# Patient Record
Sex: Male | Born: 1960 | Race: White | Hispanic: No | Marital: Married | Smoking: Current every day smoker
Health system: Southern US, Community
[De-identification: ages and names within clinical notes are randomized; demographics above are authoritative.]

## PROBLEM LIST (undated history)

## (undated) DIAGNOSIS — F99 Mental disorder, not otherwise specified: Secondary | ICD-10-CM

## (undated) DIAGNOSIS — J189 Pneumonia, unspecified organism: Secondary | ICD-10-CM

## (undated) DIAGNOSIS — I10 Essential (primary) hypertension: Secondary | ICD-10-CM

## (undated) DIAGNOSIS — G709 Myoneural disorder, unspecified: Secondary | ICD-10-CM

## (undated) DIAGNOSIS — F329 Major depressive disorder, single episode, unspecified: Secondary | ICD-10-CM

## (undated) DIAGNOSIS — G4730 Sleep apnea, unspecified: Secondary | ICD-10-CM

## (undated) DIAGNOSIS — K219 Gastro-esophageal reflux disease without esophagitis: Secondary | ICD-10-CM

## (undated) DIAGNOSIS — I2510 Atherosclerotic heart disease of native coronary artery without angina pectoris: Secondary | ICD-10-CM

## (undated) DIAGNOSIS — F419 Anxiety disorder, unspecified: Secondary | ICD-10-CM

## (undated) DIAGNOSIS — F32A Depression, unspecified: Secondary | ICD-10-CM

## (undated) HISTORY — PX: ULNAR NERVE REPAIR: SHX2594

## (undated) HISTORY — PX: APPENDECTOMY: SHX54

## (undated) HISTORY — PX: TONSILLECTOMY: SUR1361

## (undated) HISTORY — PX: KNEE ARTHROSCOPY: SUR90

## (undated) HISTORY — PX: CORONARY ANGIOPLASTY: SHX604

---

## 2007-05-17 ENCOUNTER — Encounter: Admission: RE | Admit: 2007-05-17 | Discharge: 2007-05-17 | Payer: Self-pay | Admitting: Neurology

## 2007-07-26 ENCOUNTER — Ambulatory Visit: Payer: Self-pay | Admitting: Otolaryngology

## 2007-09-15 ENCOUNTER — Encounter: Admission: RE | Admit: 2007-09-15 | Discharge: 2007-09-15 | Payer: Self-pay | Admitting: Neurology

## 2008-01-18 ENCOUNTER — Encounter: Admission: RE | Admit: 2008-01-18 | Discharge: 2008-01-18 | Payer: Self-pay | Admitting: Neurology

## 2008-02-09 HISTORY — PX: BACK SURGERY: SHX140

## 2008-02-13 ENCOUNTER — Encounter: Admission: RE | Admit: 2008-02-13 | Discharge: 2008-02-13 | Payer: Self-pay | Admitting: Neurology

## 2008-08-22 ENCOUNTER — Encounter: Admission: RE | Admit: 2008-08-22 | Discharge: 2008-08-22 | Payer: Self-pay | Admitting: Orthopedic Surgery

## 2010-02-08 DIAGNOSIS — J189 Pneumonia, unspecified organism: Secondary | ICD-10-CM

## 2010-02-08 HISTORY — DX: Pneumonia, unspecified organism: J18.9

## 2010-02-26 ENCOUNTER — Encounter
Admission: RE | Admit: 2010-02-26 | Discharge: 2010-02-26 | Payer: Self-pay | Source: Home / Self Care | Attending: Neurology | Admitting: Neurology

## 2010-03-01 ENCOUNTER — Encounter: Payer: Self-pay | Admitting: Neurology

## 2010-06-11 ENCOUNTER — Ambulatory Visit: Payer: Self-pay | Admitting: Otolaryngology

## 2010-06-25 ENCOUNTER — Other Ambulatory Visit: Payer: Self-pay | Admitting: Neurology

## 2010-06-25 DIAGNOSIS — G544 Lumbosacral root disorders, not elsewhere classified: Secondary | ICD-10-CM

## 2010-06-25 DIAGNOSIS — G63 Polyneuropathy in diseases classified elsewhere: Secondary | ICD-10-CM

## 2010-06-25 DIAGNOSIS — G5600 Carpal tunnel syndrome, unspecified upper limb: Secondary | ICD-10-CM

## 2010-06-25 DIAGNOSIS — M48061 Spinal stenosis, lumbar region without neurogenic claudication: Secondary | ICD-10-CM

## 2010-06-25 DIAGNOSIS — G5620 Lesion of ulnar nerve, unspecified upper limb: Secondary | ICD-10-CM

## 2010-06-25 DIAGNOSIS — I679 Cerebrovascular disease, unspecified: Secondary | ICD-10-CM

## 2010-07-09 ENCOUNTER — Ambulatory Visit
Admission: RE | Admit: 2010-07-09 | Discharge: 2010-07-09 | Disposition: A | Discharge status: Home / Self Care | Payer: No Typology Code available for payment source | Source: Ambulatory Visit | Attending: Neurology | Admitting: Neurology

## 2010-07-09 DIAGNOSIS — G544 Lumbosacral root disorders, not elsewhere classified: Secondary | ICD-10-CM

## 2010-07-09 DIAGNOSIS — I679 Cerebrovascular disease, unspecified: Secondary | ICD-10-CM

## 2010-07-09 DIAGNOSIS — G5600 Carpal tunnel syndrome, unspecified upper limb: Secondary | ICD-10-CM

## 2010-07-09 DIAGNOSIS — G63 Polyneuropathy in diseases classified elsewhere: Secondary | ICD-10-CM

## 2010-07-09 DIAGNOSIS — G5620 Lesion of ulnar nerve, unspecified upper limb: Secondary | ICD-10-CM

## 2010-07-09 DIAGNOSIS — M48061 Spinal stenosis, lumbar region without neurogenic claudication: Secondary | ICD-10-CM

## 2010-07-09 MED ORDER — GADOBENATE DIMEGLUMINE 529 MG/ML IV SOLN
20.0000 mL | Freq: Once | INTRAVENOUS | Status: AC | PRN
  Administered 2010-07-09: 20 mL via INTRAVENOUS

## 2010-07-23 ENCOUNTER — Other Ambulatory Visit (HOSPITAL_COMMUNITY): Payer: Medicare Other | Admitting: Radiology

## 2010-07-29 ENCOUNTER — Ambulatory Visit (HOSPITAL_COMMUNITY): Payer: Medicare Other | Attending: Neurology | Admitting: Radiology

## 2010-07-29 ENCOUNTER — Other Ambulatory Visit (HOSPITAL_COMMUNITY): Payer: Self-pay | Admitting: Neurology

## 2010-07-29 DIAGNOSIS — F17200 Nicotine dependence, unspecified, uncomplicated: Secondary | ICD-10-CM | POA: Insufficient documentation

## 2010-07-29 DIAGNOSIS — I679 Cerebrovascular disease, unspecified: Secondary | ICD-10-CM | POA: Insufficient documentation

## 2010-07-29 DIAGNOSIS — G459 Transient cerebral ischemic attack, unspecified: Secondary | ICD-10-CM

## 2010-07-29 DIAGNOSIS — I319 Disease of pericardium, unspecified: Secondary | ICD-10-CM | POA: Insufficient documentation

## 2011-03-12 ENCOUNTER — Other Ambulatory Visit: Payer: Self-pay | Admitting: Orthopedic Surgery

## 2011-05-03 ENCOUNTER — Encounter (HOSPITAL_COMMUNITY): Payer: Self-pay | Admitting: Pharmacy Technician

## 2011-05-04 ENCOUNTER — Encounter (HOSPITAL_COMMUNITY)
Admission: RE | Admit: 2011-05-04 | Discharge: 2011-05-04 | Disposition: A | Discharge status: Home / Self Care | Payer: Medicare Other | Source: Ambulatory Visit | Attending: Orthopedic Surgery | Admitting: Orthopedic Surgery

## 2011-05-04 ENCOUNTER — Encounter (HOSPITAL_COMMUNITY): Payer: Self-pay

## 2011-05-04 HISTORY — DX: Sleep apnea, unspecified: G47.30

## 2011-05-04 HISTORY — DX: Pneumonia, unspecified organism: J18.9

## 2011-05-04 HISTORY — DX: Essential (primary) hypertension: I10

## 2011-05-04 HISTORY — DX: Atherosclerotic heart disease of native coronary artery without angina pectoris: I25.10

## 2011-05-04 HISTORY — DX: Mental disorder, not otherwise specified: F99

## 2011-05-04 HISTORY — DX: Major depressive disorder, single episode, unspecified: F32.9

## 2011-05-04 HISTORY — DX: Depression, unspecified: F32.A

## 2011-05-04 HISTORY — DX: Myoneural disorder, unspecified: G70.9

## 2011-05-04 LAB — DIFFERENTIAL
Basophils Absolute: 0 10*3/uL (ref 0.0–0.1)
Basophils Relative: 1 % (ref 0–1)
Eosinophils Relative: 1 % (ref 0–5)
Monocytes Absolute: 0.6 10*3/uL (ref 0.1–1.0)
Monocytes Relative: 7 % (ref 3–12)
Neutro Abs: 5.2 10*3/uL (ref 1.7–7.7)

## 2011-05-04 LAB — COMPREHENSIVE METABOLIC PANEL
ALT: 34 U/L (ref 0–53)
AST: 26 U/L (ref 0–37)
Albumin: 4.1 g/dL (ref 3.5–5.2)
Alkaline Phosphatase: 92 U/L (ref 39–117)
CO2: 26 mEq/L (ref 19–32)
Chloride: 103 mEq/L (ref 96–112)
GFR calc non Af Amer: >90 mL/min (ref >90)
Potassium: 4.3 mEq/L (ref 3.5–5.1)
Sodium: 138 mEq/L (ref 135–145)
Total Bilirubin: 0.3 mg/dL (ref 0.3–1.2)

## 2011-05-04 LAB — CBC
MCV: 85.5 fL (ref 78.0–100.0)
Platelets: 183 10*3/uL (ref 150–400)
RBC: 5.16 MIL/uL (ref 4.22–5.81)
RDW: 12.9 % (ref 11.5–15.5)
WBC: 8.9 10*3/uL (ref 4.0–10.5)

## 2011-05-04 LAB — APTT: aPTT: 36 seconds (ref 24–37)

## 2011-05-04 LAB — TYPE AND SCREEN: ABO/RH(D): O POS

## 2011-05-04 LAB — URINALYSIS, ROUTINE W REFLEX MICROSCOPIC
Glucose, UA: NEGATIVE mg/dL
Hgb urine dipstick: NEGATIVE
Leukocytes, UA: NEGATIVE
Protein, ur: NEGATIVE mg/dL
Specific Gravity, Urine: 1.011 (ref 1.005–1.030)
Urobilinogen, UA: 1 mg/dL (ref 0.0–1.0)

## 2011-05-04 LAB — SURGICAL PCR SCREEN
MRSA, PCR: POSITIVE — AB
Staphylococcus aureus: POSITIVE — AB

## 2011-05-04 NOTE — Progress Notes (Signed)
MESSAGE LEFT FOR  GWEN..RN AT [facility] CARDIOLOGY ... DR . Jason Fila.Marland KitchenMarland KitchenREQUESTED  LAST OV  EKG  AND STRESS TEST FOR THIS UPCOMING SURGERY 05/13/11...   [phone]

## 2011-05-04 NOTE — Pre-Procedure Instructions (Signed)
20 EZEKIAL Ayala  05/04/2011   Your procedure is scheduled on:  05/13/2011  Report to [facility] at 0730 AM.  Call this number if you have problems the morning of surgery: [phone]   Remember:   Do not eat food:After Midnight.  May have clear liquids: up to 4 Hours before arrival.  Clear liquids include soda, tea, black coffee, apple or grape juice, broth.  Take these medicines the morning of surgery with A SIP OF WATER: ATENOLOL  PROZAC   HYDROXYZINE  LAMICTAL  ZANTAC   Do not wear jewelry, make-up or nail polish.  Do not wear lotions, powders, or perfumes. You may wear deodorant.  Do not shave 48 hours prior to surgery.  Do not bring valuables to the hospital.  Contacts, dentures or bridgework may not be worn into surgery.  Leave suitcase in the car. After surgery it may be brought to your room.  For patients admitted to the hospital, checkout time is 11:00 AM the day of discharge.   Patients discharged the day of surgery will not be allowed to drive home.  Name and phone number of your driver: Johnathan Ayala-  [phone]  Special Instructions: CHG Shower Use Special Wash: 1/2 bottle night before surgery and 1/2 bottle morning of surgery.   Please read over the following fact sheets that you were given: Pain Booklet, Coughing and Deep Breathing, Blood Transfusion Information, MRSA Information and Surgical Site Infection Prevention

## 2011-05-06 NOTE — Progress Notes (Signed)
3/27/ spoke with Dedra Skeens at [facility] ... refaxed information /request  For latest ov  ekg  Tests   Etc  And confirmation of  Fax received.

## 2011-05-10 ENCOUNTER — Encounter (HOSPITAL_COMMUNITY): Payer: Self-pay | Admitting: Vascular Surgery

## 2011-05-10 NOTE — Consult Note (Signed)
Anesthesia:  Patient is a 51 year old male scheduled for XLIF L2-3, L3-4, L4-5, PLIF L 2-3 on 05/13/11.    History includes smoking, CAD s/p DES LAD & DIAG '03, PNA 2012, OSA, HTN, depression.  According to [facility] records he also has a history of anxiety, Bipolar disorder, borderline and narcissistic personality traits, and prior suicidal ideation.  His Cardiologist is Dr. Christella Noa at [facility].  PCP is Dr. Lindwood Qua.  He was seen at [facility] on 04/14/11 for routine and pre-operative evaluation.  EKG then showed NSR.  He had a stress test on 03/31/11 for chest pain.  It showed normal myocardial perfusion, Global systolic function was normal , EF 63%, RV chamber size was borderline dilated, coronary calcification involving the RCA, with a stent in the LAD territory were seen, a 8mm RUL pulmonary nodule without definitive uptake was seen abutting the mediastinum.  No further Cardiac testing was recommended pre-operatively.  According to notes, the finding of a pulmonary nodule on his stress test were discussed with him and smoking cessation and follow-up with his PCP were recommended.        According to Cardiology records, his last cath was in 2004 and showed insignificant atherosclerotic CAD with patent stents, preserved LVF.  Echo done at [facility] on 07/29/10 showed: - Left ventricle: The cavity size was normal. Wall thickness was increased in a pattern of mild LVH. The estimated ejection fraction was 60%. Wall motion was normal; there were no regional wall motion abnormalities. - Pericardium, extracardiac: There is an insignificant trivial pericardial effusion along the right atrial wall.  Patient had told his PAT nurse that he was unsure if he had had a recent CXR or CT scan.  I requested both of these from [facility] and Dr. Neita Garnet office as available.  If a CXR has not been done then he will need one on the day of surgery.  If a follow-up chest CT hasn't been done, then will defer timing to  that to has PCP. It should not interfere with proceeding to the OR if he is not having any acute cardiopulmonary symptoms.         Labs acceptable.

## 2011-05-12 MED ORDER — CEFAZOLIN SODIUM-DEXTROSE 2-3 GM-% IV SOLR
2.0000 g | INTRAVENOUS | Status: DC
  Filled 2011-05-12: qty 50

## 2011-05-12 MED ORDER — POVIDONE-IODINE 7.5 % EX SOLN
Freq: Once | CUTANEOUS | Status: DC
  Filled 2011-05-12: qty 118

## 2011-05-13 ENCOUNTER — Ambulatory Visit (HOSPITAL_COMMUNITY): Payer: Medicare Other | Admitting: Vascular Surgery

## 2011-05-13 ENCOUNTER — Ambulatory Visit (HOSPITAL_COMMUNITY): Payer: Medicare Other

## 2011-05-13 ENCOUNTER — Encounter (HOSPITAL_COMMUNITY)
Admission: RE | Disposition: A | Discharge status: Home Health | Payer: Self-pay | Source: Ambulatory Visit | Attending: Orthopedic Surgery

## 2011-05-13 ENCOUNTER — Encounter (HOSPITAL_COMMUNITY): Payer: Self-pay | Admitting: Vascular Surgery

## 2011-05-13 ENCOUNTER — Inpatient Hospital Stay (HOSPITAL_COMMUNITY)
Admission: RE | Admit: 2011-05-13 | Discharge: 2011-05-16 | DRG: 457 | Disposition: A | Discharge status: Home Health | Payer: Medicare Other | Source: Ambulatory Visit | Attending: Orthopedic Surgery | Admitting: Orthopedic Surgery

## 2011-05-13 ENCOUNTER — Encounter (HOSPITAL_COMMUNITY): Payer: Self-pay | Admitting: *Deleted

## 2011-05-13 DIAGNOSIS — F3289 Other specified depressive episodes: Secondary | ICD-10-CM | POA: Diagnosis present

## 2011-05-13 DIAGNOSIS — Z9861 Coronary angioplasty status: Secondary | ICD-10-CM

## 2011-05-13 DIAGNOSIS — M48061 Spinal stenosis, lumbar region without neurogenic claudication: Secondary | ICD-10-CM | POA: Diagnosis present

## 2011-05-13 DIAGNOSIS — F17200 Nicotine dependence, unspecified, uncomplicated: Secondary | ICD-10-CM | POA: Diagnosis present

## 2011-05-13 DIAGNOSIS — M5137 Other intervertebral disc degeneration, lumbosacral region: Secondary | ICD-10-CM | POA: Diagnosis present

## 2011-05-13 DIAGNOSIS — M4800 Spinal stenosis, site unspecified: Secondary | ICD-10-CM

## 2011-05-13 DIAGNOSIS — F329 Major depressive disorder, single episode, unspecified: Secondary | ICD-10-CM | POA: Diagnosis present

## 2011-05-13 DIAGNOSIS — M51379 Other intervertebral disc degeneration, lumbosacral region without mention of lumbar back pain or lower extremity pain: Secondary | ICD-10-CM | POA: Diagnosis present

## 2011-05-13 DIAGNOSIS — F05 Delirium due to known physiological condition: Secondary | ICD-10-CM | POA: Diagnosis not present

## 2011-05-13 DIAGNOSIS — I2510 Atherosclerotic heart disease of native coronary artery without angina pectoris: Secondary | ICD-10-CM | POA: Diagnosis present

## 2011-05-13 DIAGNOSIS — I10 Essential (primary) hypertension: Secondary | ICD-10-CM | POA: Diagnosis present

## 2011-05-13 DIAGNOSIS — M4120 Other idiopathic scoliosis, site unspecified: Principal | ICD-10-CM | POA: Diagnosis present

## 2011-05-13 HISTORY — PX: ANTERIOR LAT LUMBAR FUSION: SHX1168

## 2011-05-13 SURGERY — ANTERIOR LATERAL LUMBAR FUSION 3 LEVELS
Anesthesia: General | Site: Flank | Laterality: Right

## 2011-05-13 MED ORDER — MORPHINE SULFATE (PF) 1 MG/ML IV SOLN
INTRAVENOUS | Status: DC
  Administered 2011-05-13: 18:00:00 via INTRAVENOUS
  Administered 2011-05-14: 10 mg via INTRAVENOUS
  Administered 2011-05-14: 9 mg via INTRAVENOUS

## 2011-05-13 MED ORDER — ONDANSETRON HCL 4 MG/2ML IJ SOLN: 4.0000 mg | INTRAMUSCULAR | Status: DC | PRN

## 2011-05-13 MED ORDER — SODIUM CHLORIDE 0.9 % IJ SOLN: 3.0000 mL | INTRAMUSCULAR | Status: DC | PRN

## 2011-05-13 MED ORDER — NIACIN 500 MG PO TABS
500.0000 mg | ORAL_TABLET | Freq: Every day | ORAL | Status: DC
  Administered 2011-05-14 – 2011-05-15 (×2): 500 mg via ORAL
  Filled 2011-05-13 (×4): qty 1

## 2011-05-13 MED ORDER — FENTANYL CITRATE 0.05 MG/ML IJ SOLN
INTRAMUSCULAR | Status: DC | PRN
  Administered 2011-05-13: 100 ug via INTRAVENOUS
  Administered 2011-05-13 (×3): 50 ug via INTRAVENOUS
  Administered 2011-05-13: 100 ug via INTRAVENOUS
  Administered 2011-05-13 (×3): 50 ug via INTRAVENOUS
  Administered 2011-05-13: 150 ug via INTRAVENOUS
  Administered 2011-05-13: 50 ug via INTRAVENOUS
  Administered 2011-05-13: 200 ug via INTRAVENOUS
  Administered 2011-05-13: 100 ug via INTRAVENOUS

## 2011-05-13 MED ORDER — SUCCINYLCHOLINE CHLORIDE 20 MG/ML IJ SOLN
INTRAMUSCULAR | Status: DC | PRN
  Administered 2011-05-13: 100 mg via INTRAVENOUS

## 2011-05-13 MED ORDER — ACETAMINOPHEN 650 MG RE SUPP: 650.0000 mg | RECTAL | Status: DC | PRN

## 2011-05-13 MED ORDER — MENTHOL 3 MG MT LOZG
1.0000 | LOZENGE | OROMUCOSAL | Status: DC | PRN
  Filled 2011-05-13: qty 9

## 2011-05-13 MED ORDER — ONDANSETRON HCL 4 MG/2ML IJ SOLN: 4.0000 mg | Freq: Four times a day (QID) | INTRAMUSCULAR | Status: DC | PRN

## 2011-05-13 MED ORDER — NALOXONE HCL 0.4 MG/ML IJ SOLN: 0.4000 mg | INTRAMUSCULAR | Status: DC | PRN

## 2011-05-13 MED ORDER — MEPERIDINE HCL 25 MG/ML IJ SOLN
25.0000 mg | Freq: Once | INTRAMUSCULAR | Status: AC
  Administered 2011-05-13 (×2): 12.5 mg via INTRAVENOUS

## 2011-05-13 MED ORDER — PHENOL 1.4 % MT LIQD: 1.0000 | OROMUCOSAL | Status: DC | PRN

## 2011-05-13 MED ORDER — VANCOMYCIN HCL 1000 MG IV SOLR
1500.0000 mg | Freq: Once | INTRAVENOUS | Status: AC
  Administered 2011-05-14: 1500 mg via INTRAVENOUS
  Filled 2011-05-13: qty 1500

## 2011-05-13 MED ORDER — THROMBIN 20000 UNITS EX KIT
PACK | CUTANEOUS | Status: DC | PRN
  Administered 2011-05-13: 09:00:00 via TOPICAL

## 2011-05-13 MED ORDER — VANCOMYCIN HCL IN DEXTROSE 1-5 GM/200ML-% IV SOLN
INTRAVENOUS | Status: AC
  Filled 2011-05-13: qty 200

## 2011-05-13 MED ORDER — POTASSIUM CHLORIDE IN NACL 20-0.9 MEQ/L-% IV SOLN
INTRAVENOUS | Status: DC
  Administered 2011-05-13: 80 mL/h via INTRAVENOUS
  Administered 2011-05-14: 14:00:00 via INTRAVENOUS
  Filled 2011-05-13 (×7): qty 1000

## 2011-05-13 MED ORDER — DEXAMETHASONE SODIUM PHOSPHATE 4 MG/ML IJ SOLN
INTRAMUSCULAR | Status: DC | PRN
  Administered 2011-05-13: 12 mg via INTRAVENOUS

## 2011-05-13 MED ORDER — ATENOLOL 50 MG PO TABS
50.0000 mg | ORAL_TABLET | Freq: Every day | ORAL | Status: DC
  Administered 2011-05-14 – 2011-05-16 (×3): 50 mg via ORAL
  Filled 2011-05-13 (×3): qty 1

## 2011-05-13 MED ORDER — OXYCODONE-ACETAMINOPHEN 5-325 MG PO TABS: 1.0000 | ORAL_TABLET | ORAL | Status: DC | PRN

## 2011-05-13 MED ORDER — CLOZAPINE 100 MG PO TABS
400.0000 mg | ORAL_TABLET | Freq: Every day | ORAL | Status: DC
  Administered 2011-05-13: 200 mg via ORAL
  Administered 2011-05-14 – 2011-05-15 (×2): 400 mg via ORAL
  Filled 2011-05-13 (×4): qty 4

## 2011-05-13 MED ORDER — FAMOTIDINE 20 MG PO TABS
20.0000 mg | ORAL_TABLET | Freq: Every day | ORAL | Status: DC
  Administered 2011-05-14 – 2011-05-16 (×3): 20 mg via ORAL
  Filled 2011-05-13 (×3): qty 1

## 2011-05-13 MED ORDER — DIPHENHYDRAMINE HCL 50 MG/ML IJ SOLN: 12.5000 mg | Freq: Four times a day (QID) | INTRAMUSCULAR | Status: DC | PRN

## 2011-05-13 MED ORDER — LAMOTRIGINE 200 MG PO TABS
200.0000 mg | ORAL_TABLET | Freq: Two times a day (BID) | ORAL | Status: DC
  Administered 2011-05-14 – 2011-05-16 (×5): 200 mg via ORAL
  Filled 2011-05-13 (×7): qty 1

## 2011-05-13 MED ORDER — DIPHENHYDRAMINE HCL 12.5 MG/5ML PO ELIX
12.5000 mg | ORAL_SOLUTION | Freq: Four times a day (QID) | ORAL | Status: DC | PRN
  Filled 2011-05-13: qty 5

## 2011-05-13 MED ORDER — ONDANSETRON HCL 4 MG/2ML IJ SOLN
INTRAMUSCULAR | Status: DC | PRN
  Administered 2011-05-13: 4 mg via INTRAVENOUS

## 2011-05-13 MED ORDER — CALCIUM CHLORIDE 10 % IV SOLN
INTRAVENOUS | Status: DC | PRN
  Administered 2011-05-13: .1 g via INTRAVENOUS

## 2011-05-13 MED ORDER — HYDROMORPHONE HCL PF 1 MG/ML IJ SOLN: 0.5000 mg | INTRAMUSCULAR | Status: DC | PRN

## 2011-05-13 MED ORDER — 0.9 % SODIUM CHLORIDE (POUR BTL) OPTIME
TOPICAL | Status: DC | PRN
  Administered 2011-05-13 (×2): 1000 mL

## 2011-05-13 MED ORDER — ZOLPIDEM TARTRATE 5 MG PO TABS: 5.0000 mg | ORAL_TABLET | Freq: Every evening | ORAL | Status: DC | PRN

## 2011-05-13 MED ORDER — VANCOMYCIN HCL 1000 MG IV SOLR
1000.0000 mg | INTRAVENOUS | Status: DC | PRN
  Administered 2011-05-13: 1000 mg via INTRAVENOUS

## 2011-05-13 MED ORDER — LACTATED RINGERS IV SOLN
INTRAVENOUS | Status: DC | PRN
  Administered 2011-05-13 (×5): via INTRAVENOUS

## 2011-05-13 MED ORDER — LIDOCAINE HCL (CARDIAC) 20 MG/ML IV SOLN
INTRAVENOUS | Status: DC | PRN
  Administered 2011-05-13: 80 mg via INTRAVENOUS

## 2011-05-13 MED ORDER — MORPHINE SULFATE 4 MG/ML IJ SOLN
2.0000 mg | INTRAMUSCULAR | Status: DC | PRN
  Administered 2011-05-14 (×2): 2 mg via INTRAVENOUS
  Filled 2011-05-13 (×2): qty 1

## 2011-05-13 MED ORDER — BUPIVACAINE-EPINEPHRINE 0.25% -1:200000 IJ SOLN
INTRAMUSCULAR | Status: DC | PRN
  Administered 2011-05-13: 5.5 mL
  Administered 2011-05-13: 9 mL

## 2011-05-13 MED ORDER — PROPOFOL 10 MG/ML IV EMUL
INTRAVENOUS | Status: DC | PRN
  Administered 2011-05-13: 50 mg via INTRAVENOUS
  Administered 2011-05-13: 300 mg via INTRAVENOUS

## 2011-05-13 MED ORDER — PROPOFOL 10 MG/ML IV EMUL
INTRAVENOUS | Status: DC | PRN
  Administered 2011-05-13: 100 ug/kg/min via INTRAVENOUS

## 2011-05-13 MED ORDER — ONDANSETRON HCL 4 MG/2ML IJ SOLN: 4.0000 mg | Freq: Once | INTRAMUSCULAR | Status: DC | PRN

## 2011-05-13 MED ORDER — DOCUSATE SODIUM 100 MG PO CAPS
100.0000 mg | ORAL_CAPSULE | Freq: Two times a day (BID) | ORAL | Status: DC
  Administered 2011-05-14 – 2011-05-16 (×5): 100 mg via ORAL
  Filled 2011-05-13 (×6): qty 1

## 2011-05-13 MED ORDER — SODIUM CHLORIDE 0.9 % IV SOLN: 250.0000 mL | INTRAVENOUS | Status: DC

## 2011-05-13 MED ORDER — PHENYLEPHRINE HCL 10 MG/ML IJ SOLN
INTRAMUSCULAR | Status: DC | PRN
  Administered 2011-05-13 (×6): 80 ug via INTRAVENOUS

## 2011-05-13 MED ORDER — HYDROMORPHONE HCL PF 1 MG/ML IJ SOLN
0.2500 mg | INTRAMUSCULAR | Status: DC | PRN
  Administered 2011-05-13 (×2): 0.5 mg via INTRAVENOUS
  Administered 2011-05-13: 19:00:00 via INTRAVENOUS
  Administered 2011-05-13 (×3): 0.5 mg via INTRAVENOUS

## 2011-05-13 MED ORDER — SODIUM CHLORIDE 0.9 % IJ SOLN: 9.0000 mL | INTRAMUSCULAR | Status: DC | PRN

## 2011-05-13 MED ORDER — ACETAMINOPHEN 325 MG PO TABS: 650.0000 mg | ORAL_TABLET | ORAL | Status: DC | PRN

## 2011-05-13 MED ORDER — MIDAZOLAM HCL 5 MG/5ML IJ SOLN
INTRAMUSCULAR | Status: DC | PRN
  Administered 2011-05-13: 2 mg via INTRAVENOUS

## 2011-05-13 MED ORDER — EPHEDRINE SULFATE 50 MG/ML IJ SOLN
INTRAMUSCULAR | Status: DC | PRN
  Administered 2011-05-13 (×2): 15 mg via INTRAVENOUS
  Administered 2011-05-13: 20 mg via INTRAVENOUS

## 2011-05-13 MED ORDER — HETASTARCH-ELECTROLYTES 6 % IV SOLN
INTRAVENOUS | Status: DC | PRN
  Administered 2011-05-13 (×2): via INTRAVENOUS

## 2011-05-13 MED ORDER — SENNA 8.6 MG PO TABS
1.0000 | ORAL_TABLET | Freq: Two times a day (BID) | ORAL | Status: DC
  Administered 2011-05-14 – 2011-05-16 (×5): 8.6 mg via ORAL
  Filled 2011-05-13 (×7): qty 1

## 2011-05-13 MED ORDER — SODIUM CHLORIDE 0.9 % IJ SOLN
3.0000 mL | Freq: Two times a day (BID) | INTRAMUSCULAR | Status: DC
  Administered 2011-05-14 – 2011-05-16 (×4): 3 mL via INTRAVENOUS

## 2011-05-13 MED ORDER — HYDROXYZINE HCL 50 MG PO TABS
50.0000 mg | ORAL_TABLET | Freq: Two times a day (BID) | ORAL | Status: DC
  Administered 2011-05-14 – 2011-05-16 (×5): 50 mg via ORAL
  Filled 2011-05-13 (×7): qty 1

## 2011-05-13 MED ORDER — FLUOXETINE HCL 20 MG PO CAPS
60.0000 mg | ORAL_CAPSULE | Freq: Every day | ORAL | Status: DC
  Administered 2011-05-14 – 2011-05-16 (×3): 60 mg via ORAL
  Filled 2011-05-13 (×3): qty 3

## 2011-05-13 MED ORDER — DIAZEPAM 5 MG PO TABS: 5.0000 mg | ORAL_TABLET | Freq: Four times a day (QID) | ORAL | Status: DC | PRN

## 2011-05-13 MED ORDER — PHENYLEPHRINE HCL 10 MG/ML IJ SOLN
10.0000 mg | INTRAVENOUS | Status: DC | PRN
  Administered 2011-05-13: 10 ug/min via INTRAVENOUS
  Administered 2011-05-13: 09:00:00 via INTRAVENOUS

## 2011-05-13 MED ORDER — ADULT MULTIVITAMIN W/MINERALS CH
1.0000 | ORAL_TABLET | Freq: Every day | ORAL | Status: DC
  Administered 2011-05-14 – 2011-05-16 (×3): 1 via ORAL
  Filled 2011-05-13 (×3): qty 1

## 2011-05-13 MED ORDER — ALUM & MAG HYDROXIDE-SIMETH 200-200-20 MG/5ML PO SUSP: 30.0000 mL | Freq: Four times a day (QID) | ORAL | Status: DC | PRN

## 2011-05-13 MED ORDER — OXYCODONE HCL 20 MG PO TB12
20.0000 mg | ORAL_TABLET | Freq: Two times a day (BID) | ORAL | Status: DC
  Administered 2011-05-14 – 2011-05-15 (×4): 20 mg via ORAL
  Filled 2011-05-13 (×4): qty 1

## 2011-05-13 SURGICAL SUPPLY — 114 items
APPLIER CLIP 11 MED OPEN (CLIP) ×3
ATTRACTOMAT 16X20 MAGNETIC DRP (DRAPES) ×3 IMPLANT
BENZOIN TINCTURE PRP APPL 2/3 (GAUZE/BANDAGES/DRESSINGS) ×12 IMPLANT
BLADE SURG 10 STRL SS (BLADE) ×3 IMPLANT
BLADE SURG ROTATE 9660 (MISCELLANEOUS) ×3 IMPLANT
BUR ROUND PRECISION 4.0 (BURR) ×3 IMPLANT
CAGE COUGAR LATERAL 18X10X55 (Cage) ×3 IMPLANT
CARTRIDGE OIL MAESTRO DRILL (MISCELLANEOUS) ×2 IMPLANT
CLIP APPLIE 11 MED OPEN (CLIP) ×2 IMPLANT
CLOTH BEACON ORANGE TIMEOUT ST (SAFETY) ×3 IMPLANT
CONT SPEC STER OR (MISCELLANEOUS) IMPLANT
CORDS BIPOLAR (ELECTRODE) ×6 IMPLANT
COVER MAYO STAND STRL (DRAPES) ×27 IMPLANT
COVER SURGICAL LIGHT HANDLE (MISCELLANEOUS) ×6 IMPLANT
Cougar LS Cage 18x12x55 Parallel ×6 IMPLANT
DIFFUSER DRILL AIR PNEUMATIC (MISCELLANEOUS) IMPLANT
DISSECTOR BLUNT TIP ENDO 5MM (MISCELLANEOUS) ×3 IMPLANT
DRAIN CHANNEL 15F RND FF W/TCR (WOUND CARE) IMPLANT
DRAPE C-ARM 42X72 X-RAY (DRAPES) ×9 IMPLANT
DRAPE INCISE IOBAN 66X45 STRL (DRAPES) ×9 IMPLANT
DRAPE ORTHO SPLIT 77X108 STRL (DRAPES) ×1
DRAPE POUCH INSTRU U-SHP 10X18 (DRAPES) ×6 IMPLANT
DRAPE SURG 17X23 STRL (DRAPES) ×24 IMPLANT
DRAPE SURG ORHT 6 SPLT 77X108 (DRAPES) ×2 IMPLANT
DRAPE U-SHAPE 47X51 STRL (DRAPES) IMPLANT
DRSG MEPILEX BORDER 4X12 (GAUZE/BANDAGES/DRESSINGS) IMPLANT
DRSG MEPILEX BORDER 4X8 (GAUZE/BANDAGES/DRESSINGS) IMPLANT
DURAPREP 26ML APPLICATOR (WOUND CARE) IMPLANT
ELECT BLADE 4.0 EZ CLEAN MEGAD (MISCELLANEOUS) ×3
ELECT BLADE 6.5 EXT (BLADE) ×3 IMPLANT
ELECT CAUTERY BLADE 6.4 (BLADE) ×3 IMPLANT
ELECT REM PT RETURN 9FT ADLT (ELECTROSURGICAL) ×6
ELECTRODE BLDE 4.0 EZ CLN MEGD (MISCELLANEOUS) ×2 IMPLANT
ELECTRODE REM PT RTRN 9FT ADLT (ELECTROSURGICAL) ×4 IMPLANT
EVACUATOR SILICONE 100CC (DRAIN) IMPLANT
GAUZE SPONGE 4X4 16PLY XRAY LF (GAUZE/BANDAGES/DRESSINGS) ×12 IMPLANT
GLOVE BIO SURGEON STRL SZ7 (GLOVE) IMPLANT
GLOVE BIO SURGEON STRL SZ8 (GLOVE) ×9 IMPLANT
GLOVE BIOGEL PI IND STRL 7.0 (GLOVE) IMPLANT
GLOVE BIOGEL PI IND STRL 8 (GLOVE) ×2 IMPLANT
GLOVE BIOGEL PI IND STRL 8.5 (GLOVE) ×2 IMPLANT
GLOVE BIOGEL PI INDICATOR 7.0 (GLOVE)
GLOVE BIOGEL PI INDICATOR 8 (GLOVE) ×1
GLOVE BIOGEL PI INDICATOR 8.5 (GLOVE) ×1
GLOVE BIOGEL PI ORTHO PRO SZ7 (GLOVE) ×2
GLOVE ECLIPSE 8.5 STRL (GLOVE) ×6 IMPLANT
GLOVE PI ORTHO PRO STRL SZ7 (GLOVE) ×4 IMPLANT
GLOVE SURG SS PI 7.0 STRL IVOR (GLOVE) ×6 IMPLANT
GOWN PREVENTION PLUS XLARGE (GOWN DISPOSABLE) ×6 IMPLANT
GOWN STRL NON-REIN LRG LVL3 (GOWN DISPOSABLE) ×12 IMPLANT
GOWN STRL REIN XL XLG (GOWN DISPOSABLE) ×12 IMPLANT
GUIDEWIRE PIPELINE IS BLUNT (WIRE) ×6 IMPLANT
GUIDEWIRE SHARP VIPER II (WIRE) ×24 IMPLANT
IV CATH 14GX2 1/4 (CATHETERS) ×3 IMPLANT
KIT BASIN OR (CUSTOM PROCEDURE TRAY) ×3 IMPLANT
KIT ORACLE NEUROMONITING (KITS) ×3 IMPLANT
KIT POSITION SURG JACKSON T1 (MISCELLANEOUS) IMPLANT
KIT ROOM TURNOVER OR (KITS) ×3 IMPLANT
MARKER SKIN DUAL TIP RULER LAB (MISCELLANEOUS) ×3 IMPLANT
MIX DBX 10CC 35% BONE (Bone Implant) ×3 IMPLANT
MIX DBX 20CC MTF (Putty) ×3 IMPLANT
NEEDLE BONE MARROW 8GX6 FENEST (NEEDLE) IMPLANT
NEEDLE HYPO 25GX1X1/2 BEV (NEEDLE) ×3 IMPLANT
NEEDLE JAMSHIDI VIPER (NEEDLE) ×6 IMPLANT
NEEDLE SPNL 18GX3.5 QUINCKE PK (NEEDLE) IMPLANT
NS IRRIG 1000ML POUR BTL (IV SOLUTION) ×6 IMPLANT
OIL CARTRIDGE MAESTRO DRILL (MISCELLANEOUS) ×3
ORACLE NEUROMONITORING KIT ×3 IMPLANT
PACK LAMINECTOMY ORTHO (CUSTOM PROCEDURE TRAY) ×3 IMPLANT
PACK UNIVERSAL I (CUSTOM PROCEDURE TRAY) ×6 IMPLANT
PAD ARMBOARD 7.5X6 YLW CONV (MISCELLANEOUS) ×9 IMPLANT
PAD SHARPS MAGNETIC DISPOSAL (MISCELLANEOUS) ×3 IMPLANT
PATTIES SURGICAL .5 X1 (DISPOSABLE) ×3 IMPLANT
PATTIES SURGICAL .5 X3 (DISPOSABLE) IMPLANT
PATTIES SURGICAL .5X1.5 (GAUZE/BANDAGES/DRESSINGS) ×3 IMPLANT
PATTIES SURGICAL .75X.75 (GAUZE/BANDAGES/DRESSINGS) IMPLANT
SCREW POLY X-TAB VIPER 7.0X50 (Screw) ×12 IMPLANT
SCREW SET SINGLE INNER MIS (Screw) ×24 IMPLANT
SLEEVE SURGEON STRL (DRAPES) ×6 IMPLANT
SPONGE GAUZE 4X4 12PLY (GAUZE/BANDAGES/DRESSINGS) ×9 IMPLANT
SPONGE INTESTINAL PEANUT (DISPOSABLE) ×6 IMPLANT
SPONGE LAP 4X18 X RAY DECT (DISPOSABLE) ×3 IMPLANT
SPONGE SURGIFOAM ABS GEL 100 (HEMOSTASIS) ×3 IMPLANT
STRIP CLOSURE SKIN 1/2X4 (GAUZE/BANDAGES/DRESSINGS) ×15 IMPLANT
SURGIFLO TRUKIT (HEMOSTASIS) IMPLANT
SUT MNCRL AB 3-0 PS2 18 (SUTURE) IMPLANT
SUT MNCRL AB 4-0 PS2 18 (SUTURE) ×12 IMPLANT
SUT PROLENE 5 0 C 1 24 (SUTURE) IMPLANT
SUT PROLENE 6 0 C 1 24 (SUTURE) IMPLANT
SUT SILK 2 0 TIES 10X30 (SUTURE) ×3 IMPLANT
SUT SILK 3 0 TIES 10X30 (SUTURE) ×3 IMPLANT
SUT VIC AB 0 CT1 18XCR BRD 8 (SUTURE) ×2 IMPLANT
SUT VIC AB 0 CT1 8-18 (SUTURE) ×1
SUT VIC AB 1 CT1 18XCR BRD 8 (SUTURE) ×4 IMPLANT
SUT VIC AB 1 CT1 27 (SUTURE)
SUT VIC AB 1 CT1 27XBRD ANBCTR (SUTURE) IMPLANT
SUT VIC AB 1 CT1 8-18 (SUTURE) ×2
SUT VIC AB 1 CTX 36 (SUTURE)
SUT VIC AB 1 CTX36XBRD ANBCTR (SUTURE) IMPLANT
SUT VIC AB 2-0 CT2 18 VCP726D (SUTURE) ×9 IMPLANT
SYR 20CC LL (SYRINGE) ×3 IMPLANT
SYR BULB IRRIGATION 50ML (SYRINGE) ×3 IMPLANT
SYR CONTROL 10ML LL (SYRINGE) ×3 IMPLANT
TAPE CLOTH SURG 4X10 WHT LF (GAUZE/BANDAGES/DRESSINGS) ×3 IMPLANT
TAPE CLOTH SURG 6X10 WHT LF (GAUZE/BANDAGES/DRESSINGS) ×3 IMPLANT
TOWEL OR 17X24 6PK STRL BLUE (TOWEL DISPOSABLE) ×3 IMPLANT
TOWEL OR 17X26 10 PK STRL BLUE (TOWEL DISPOSABLE) ×3 IMPLANT
TRAY FOLEY CATH 14FR (SET/KITS/TRAYS/PACK) ×3 IMPLANT
TRAY FOLEY CATH 14FRSI W/METER (CATHETERS) ×3 IMPLANT
Viper Pre Bent Rod 100mm ×3 IMPLANT
Viper Rod Straight 120mm ×3 IMPLANT
WATER STERILE IRR 1000ML POUR (IV SOLUTION) IMPLANT
YANKAUER SUCT BULB TIP NO VENT (SUCTIONS) ×3 IMPLANT
viper xtab poly screw 7.5x50 ×12 IMPLANT

## 2011-05-13 NOTE — Preoperative (Signed)
Beta Blockers   Reason not to administer Beta Blockers:Not Applicable 

## 2011-05-13 NOTE — Anesthesia Preprocedure Evaluation (Signed)
Anesthesia Evaluation  Patient identified by MRN, date of birth, ID band Patient awake    Reviewed: Allergy & Precautions, H&P , NPO status , Patient's Chart, lab work & pertinent test results  Airway Mallampati: II TM Distance: >3 FB Neck ROM: full    Dental   Pulmonary sleep apnea , pneumonia ,          Cardiovascular hypertension, + CAD Rhythm:regular Rate:Normal     Neuro/Psych PSYCHIATRIC DISORDERS Depression  Neuromuscular disease    GI/Hepatic   Endo/Other    Renal/GU      Musculoskeletal   Abdominal   Peds  Hematology   Anesthesia Other Findings   Reproductive/Obstetrics                           Anesthesia Physical Anesthesia Plan  ASA: III  Anesthesia Plan: General ETT   Post-op Pain Management:    Induction: Intravenous  Airway Management Planned: Oral ETT  Additional Equipment:   Intra-op Plan:   Post-operative Plan: Extubation in OR  Informed Consent: I have reviewed the patients History and Physical, chart, labs and discussed the procedure including the risks, benefits and alternatives for the proposed anesthesia with the patient or authorized representative who has indicated his/her understanding and acceptance.     Plan Discussed with: Anesthesiologist, CRNA and Surgeon  Anesthesia Plan Comments:         Anesthesia Quick Evaluation

## 2011-05-13 NOTE — Progress Notes (Signed)
Orthopedic Tech Progress Note Patient Details:  Johnathan Ayala [birth year] [id number]  Patient ID: Johnathan Ayala, male   DOB: [birth year], 51 y.o.   MRN: [id number] Order completed by Johnathan Ayala, Johnathan Ayala 05/13/2011, 4:20 PM

## 2011-05-13 NOTE — OR Nursing (Signed)
Procedure one - Extreme Lateral Interbody Fusion Lumbar 2-3, Lumbar 3-4, Lumbar 4-5 procedure complete at 1340.  Patient was positioned prone on the [employer].  Procedure two - Posterior Spinal Fusion  Lumbar 2-3, 3-4, 4-5 procedure started at 1419.

## 2011-05-13 NOTE — Progress Notes (Signed)
Dr Ivin Booty here to see pt.  When i call pt's name, he says "What?" but will not answer verbally to any questions. He is moving his arms, not to command, moves legs slightly, not to command.  He will say, "Oh my God", when I ask him what's wrong. Ord for more Dilaudid.  Will cont to monitor closely.

## 2011-05-13 NOTE — Progress Notes (Signed)
Wife here at bedside, informative about pt's meds and bipolar disorder.  Pt more awake, recognizes his wife. MAE with good strength . Seems better after iv Demerol. Dr Ivin Booty is also here--he has updated dr Yevette Edwards. Pt to go to 3300 bed. Will cont to monitor closely.

## 2011-05-13 NOTE — Anesthesia Procedure Notes (Signed)
Procedure Name: Intubation Date/Time: 05/13/2011 7:44 AM Performed by: Jerilee Hoh Pre-anesthesia Checklist: Patient identified, Emergency Drugs available, Suction available and Patient being monitored Patient Re-evaluated:Patient Re-evaluated prior to inductionOxygen Delivery Method: Circle system utilized Preoxygenation: Pre-oxygenation with 100% oxygen Intubation Type: IV induction Ventilation: Mask ventilation without difficulty Laryngoscope Size: Mac and 4 Grade View: Grade I Tube type: Oral Tube size: 7.5 mm Number of attempts: 1 Airway Equipment and Method: Stylet Placement Confirmation: ETT inserted through vocal cords under direct vision,  positive ETCO2 and breath sounds checked- equal and bilateral Secured at: 23 cm Tube secured with: Tape Dental Injury: Teeth and Oropharynx as per pre-operative assessment

## 2011-05-13 NOTE — Addendum Note (Signed)
Addendum  created 05/13/11 1854 by Kerby Nora, MD   Modules edited:Orders

## 2011-05-13 NOTE — Addendum Note (Signed)
Addendum  created 05/13/11 1937 by Kerby Nora, MD   Modules edited:Orders

## 2011-05-13 NOTE — Progress Notes (Signed)
ANTIBIOTIC CONSULT NOTE - INITIAL  Pharmacy Consult for Vancomycin Indication: post-op prophylaxis  No Known Allergies  Patient Measurements: Height: 6' (182.9 cm) Weight: 249 lb 1.9 oz (113 kg) IBW/kg (Calculated) : 77.6   Vital Signs: Temp: 98.8 F (37.1 C) (04/04 2051) Temp src: Oral (04/04 2051) BP: 138/69 mmHg (04/04 2051) Pulse Rate: 94  (04/04 2019) Intake/Output from previous day:   Intake/Output from this shift: Total I/O In: 200 [I.V.:200] Out: 450 [Urine:450]  Labs: No results found for this basename: WBC:3,HGB:3,PLT:3,LABCREA:3,CREATININE:3 in the last 72 hours Estimated Creatinine Clearance: 119.5 ml/min (by C-G formula based on Cr of 0.96). No results found for this basename: VANCOTROUGH:2,VANCOPEAK:2,VANCORANDOM:2,GENTTROUGH:2,GENTPEAK:2,GENTRANDOM:2,TOBRATROUGH:2,TOBRAPEAK:2,TOBRARND:2,AMIKACINPEAK:2,AMIKACINTROU:2,AMIKACIN:2, in the last 72 hours   Microbiology: Recent Results (from the past 720 hour(s))  SURGICAL PCR SCREEN     Status: Abnormal   Collection Time   05/04/11 10:58 AM      Component Value Range Status Comment   MRSA, PCR POSITIVE (*) NEGATIVE  Final    Staphylococcus aureus POSITIVE (*) NEGATIVE  Final     Medical History: Past Medical History  Diagnosis Date  . Hypertension   . Pneumonia 2012  . Sleep apnea     study done 7-9 years ago . uses  c-pap  . Neuromuscular disorder   . Mental disorder   . Depression   . Coronary artery disease     [facility] (Dr. Christella Noa)    Assessment: [name]. Received Vancomycin 1gm IV pre-op at 0800.  Good renal function.   Plan:  1) Vancomycin 1500mg  IV x1 dose post-op. 2) Pt also on Clozoril with normal WBC.  Will monitor WBC per protocol while patient hospitalized.  Johnathan Ayala 05/13/2011,9:13 PM

## 2011-05-13 NOTE — Transfer of Care (Signed)
Immediate Anesthesia Transfer of Care Note  Patient: SALLIE MAKER  Procedure(s) Performed: Procedure(s) (LRB): ANTERIOR LATERAL LUMBAR FUSION 3 LEVELS (Right) POSTERIOR LUMBAR FUSION 3 LEVEL (N/A)  Patient Location: PACU  Anesthesia Type: General  Level of Consciousness: sedated  Airway & Oxygen Therapy: Patient Spontanous Breathing and Patient connected to face mask oxygen  Post-op Assessment: Report given to PACU RN, Post -op Vital signs reviewed and stable and Patient moving all extremities X 4  Post vital signs: Reviewed and stable  Complications: No apparent anesthesia complications

## 2011-05-13 NOTE — Anesthesia Postprocedure Evaluation (Signed)
  Anesthesia Post-op Note  Patient: Johnathan Ayala  Procedure(s) Performed: Procedure(s) (LRB): ANTERIOR LATERAL LUMBAR FUSION 3 LEVELS (Right) POSTERIOR LUMBAR FUSION 3 LEVEL (N/A)  Patient Location: PACU  Anesthesia Type: General  Level of Consciousness: awake, alert  and oriented  Airway and Oxygen Therapy: Patient Spontanous Breathing and Patient connected to face mask oxygen  Post-op Pain: mild  Post-op Assessment: Post-op Vital signs reviewed, Patient's Cardiovascular Status Stable, Respiratory Function Stable, Patent Airway, No signs of Nausea or vomiting and Pain level controlled  Post-op Vital Signs: Reviewed and stable  Complications: No apparent anesthesia complications

## 2011-05-13 NOTE — H&P (Signed)
PREOPERATIVE H&P  Chief Complaint: bilateral leg pain   HPI: Johnathan Ayala is a 51 y.o. male who presents with bilateral leg pain  Past Medical History  Diagnosis Date  . Hypertension   . Pneumonia 2012  . Sleep apnea     study done 7-9 years ago . uses  c-pap  . Neuromuscular disorder   . Mental disorder   . Depression   . Coronary artery disease     [facility] (Dr. Christella Noa)   Past Surgical History  Procedure Date  . Appendectomy   . Back surgery 2010  . Tonsillectomy   . Knee arthroscopy 2 yrs ago.     right   . Coronary angioplasty     DES LAD, DIAG '03   History   Social History  . Marital Status: Married    Spouse Name: N/A    Number of Children: N/A  . Years of Education: N/A   Social History Main Topics  . Smoking status: Current Everyday Smoker -- 1.5 packs/day    Types: Cigarettes  . Smokeless tobacco: Not on file  . Alcohol Use: No  . Drug Use: No  . Sexually Active:    Other Topics Concern  . Not on file   Social History Narrative  . No narrative on file   Family History  Problem Relation Age of Onset  . Anesthesia problems Neg Hx   . Hypotension Neg Hx   . Malignant hyperthermia Neg Hx   . Pseudochol deficiency Neg Hx    No Known Allergies Prior to Admission medications   Medication Sig Start Date End Date Taking? Authorizing Provider  aspirin 81 MG chewable tablet Chew 81 mg by mouth at bedtime.   Yes Historical Provider, MD  atenolol (TENORMIN) 50 MG tablet Take 50 mg by mouth daily.   Yes Historical Provider, MD  cloZAPine (CLOZARIL) 100 MG tablet Take 400 mg by mouth at bedtime.   Yes Historical Provider, MD  FLUoxetine (PROZAC) 20 MG capsule Take 60 mg by mouth daily.   Yes Historical Provider, MD  GLUCOSAMINE HCL PO Take 1,000 mg by mouth 2 (two) times daily.   Yes Historical Provider, MD  hydrOXYzine (ATARAX/VISTARIL) 50 MG tablet Take 50 mg by mouth 2 (two) times daily.   Yes Historical Provider, MD  lamoTRIgine (LAMICTAL) 200 MG  tablet Take 200 mg by mouth 2 (two) times daily.   Yes Historical Provider, MD  Multiple Vitamin (MULITIVITAMIN WITH MINERALS) TABS Take 1 tablet by mouth daily.   Yes Historical Provider, MD  niacin 500 MG tablet Take 500 mg by mouth at bedtime.   Yes Historical Provider, MD  Omega-3 Fatty Acids (FISH OIL) 1000 MG CAPS Take 1 capsule by mouth 2 (two) times daily at 10 AM and 5 PM.   Yes Historical Provider, MD  ranitidine (ZANTAC) 150 MG tablet Take 150 mg by mouth 2 (two) times daily.   Yes Historical Provider, MD     All other systems have been reviewed and were otherwise negative with the exception of those mentioned in the HPI and as above.  Physical Exam: There were no vitals filed for this visit.  General: Alert, no acute distress Cardiovascular: No pedal edema Respiratory: No cyanosis, no use of accessory musculature GI: No organomegaly, abdomen is soft and non-tender Skin: No lesions in the area of chief complaint Neurologic: Sensation intact distally Psychiatric: Patient is competent for consent with normal mood and affect Lymphatic: No axillary or cervical lymphadenopathy  MUSCULOSKELETAL: + TTP low back  Assessment/Plan: Bilateral leg pain Plan for Procedure(s): ANTERIOR LATERAL LUMBAR FUSION L2-5 POSTERIOR LUMBAR FUSION L2-5   Emilee Hero, MD 05/13/2011 6:13 AM

## 2011-05-14 ENCOUNTER — Encounter (HOSPITAL_COMMUNITY): Payer: Self-pay | Admitting: Orthopedic Surgery

## 2011-05-14 LAB — BASIC METABOLIC PANEL
Calcium: 8.9 mg/dL (ref 8.4–10.5)
GFR calc Af Amer: >90 mL/min (ref >90)
GFR calc non Af Amer: >90 mL/min (ref >90)
Glucose, Bld: 132 mg/dL — ABNORMAL HIGH (ref 70–99)
Sodium: 138 mEq/L (ref 135–145)

## 2011-05-14 LAB — URINALYSIS, ROUTINE W REFLEX MICROSCOPIC
Bilirubin Urine: NEGATIVE
Protein, ur: NEGATIVE mg/dL
Urobilinogen, UA: 1 mg/dL (ref 0.0–1.0)

## 2011-05-14 MED ORDER — MORPHINE SULFATE (PF) 1 MG/ML IV SOLN
INTRAVENOUS | Status: AC
  Administered 2011-05-14: 05:00:00
  Filled 2011-05-14: qty 25

## 2011-05-14 MED ORDER — DIAZEPAM 5 MG/ML IJ SOLN
5.0000 mg | Freq: Four times a day (QID) | INTRAMUSCULAR | Status: DC | PRN
  Administered 2011-05-14: 5 mg via INTRAVENOUS
  Filled 2011-05-14: qty 2

## 2011-05-14 MED ORDER — OXYCODONE-ACETAMINOPHEN 5-325 MG PO TABS: 1.0000 | ORAL_TABLET | ORAL | Status: DC | PRN

## 2011-05-14 MED ORDER — DIAZEPAM 5 MG PO TABS
5.0000 mg | ORAL_TABLET | Freq: Four times a day (QID) | ORAL | Status: DC
  Administered 2011-05-14 – 2011-05-16 (×7): 5 mg via ORAL
  Filled 2011-05-14 (×7): qty 1

## 2011-05-14 MED ORDER — DIAZEPAM 5 MG/ML IJ SOLN
INTRAMUSCULAR | Status: AC
  Filled 2011-05-14: qty 2

## 2011-05-14 MED ORDER — HYDROCODONE-ACETAMINOPHEN 7.5-325 MG PO TABS
1.0000 | ORAL_TABLET | ORAL | Status: DC | PRN
  Administered 2011-05-14: 2 via ORAL
  Administered 2011-05-15: 1 via ORAL
  Administered 2011-05-16: 2 via ORAL
  Filled 2011-05-14: qty 2
  Filled 2011-05-14: qty 1
  Filled 2011-05-14 (×2): qty 2

## 2011-05-14 MED ORDER — CHLORHEXIDINE GLUCONATE CLOTH 2 % EX PADS
6.0000 | MEDICATED_PAD | Freq: Every day | CUTANEOUS | Status: DC
  Administered 2011-05-14 – 2011-05-16 (×3): 6 via TOPICAL

## 2011-05-14 MED ORDER — MUPIROCIN 2 % EX OINT
1.0000 "application " | TOPICAL_OINTMENT | Freq: Two times a day (BID) | CUTANEOUS | Status: DC
  Administered 2011-05-14 – 2011-05-16 (×5): 1 via NASAL
  Filled 2011-05-14: qty 22

## 2011-05-14 MED ORDER — TRAMADOL HCL 50 MG PO TABS
50.0000 mg | ORAL_TABLET | ORAL | Status: DC
  Administered 2011-05-14 – 2011-05-16 (×11): 50 mg via ORAL
  Filled 2011-05-14 (×11): qty 1

## 2011-05-14 MED ORDER — DIAZEPAM 5 MG PO TABS
5.0000 mg | ORAL_TABLET | ORAL | Status: DC
  Filled 2011-05-14: qty 1

## 2011-05-14 MED FILL — Cefazolin Sodium for IV Soln 2 GM and Dextrose 3% (50 ML): INTRAVENOUS | Qty: 50 | Status: AC

## 2011-05-14 NOTE — Progress Notes (Signed)
Physical Therapy Treatment Patient Details Name: Johnathan Ayala MRN: [id number] DOB: August 15, 1960 Today's Date: 05/14/2011  PT Assessment/Plan  PT - Assessment/Plan Comments on Treatment Session: Pt admitted s/p lumbar fusion and has much improved cognition this pm.  Pt able to increase attention to sustained as well as follow one-step commands this pm.  Pt also able to increase ambulation distance/independence. PT Plan: Discharge plan remains appropriate;Frequency remains appropriate PT Frequency: Min 5X/week Follow Up Recommendations: Home health PT Equipment Recommended: None recommended by PT PT Goals  Acute Rehab PT Goals PT Goal Formulation: With patient/family Time For Goal Achievement: 7 days PT Goal: Rolling Supine to Left Side - Progress: Progressing toward goal PT Goal: Sit to Supine/Side - Progress: Progressing toward goal PT Goal: Sit to Stand - Progress: Progressing toward goal PT Goal: Stand to Sit - Progress: Progressing toward goal PT Goal: Ambulate - Progress: Progressing toward goal  PT Treatment Precautions/Restrictions  Precautions Precautions: Back Precaution Booklet Issued: No Precaution Comments: Re-educated pt on 3/3 back precautions as pt unable to recall any this pm. Required Braces or Orthoses: Yes Spinal Brace: Thoracolumbosacral orthotic;Other (comment);Applied in sitting position (With right leg extension.) Spinal Brace Comments: Total assist EOB to doff brace. Restrictions Weight Bearing Restrictions: No Mobility (including Balance) Bed Mobility Bed Mobility: Yes Rolling Left: 5: Supervision Rolling Left Details (indicate cue type and reason): Verbal cues for sequence. Sit to Sidelying Right: 4: Min assist;With rail;HOB flat Sit to Sidelying Right Details (indicate cue type and reason): Assist for bilateral LEs to get back into bed.  Cues for sequence. Transfers Transfers: Yes Sit to Stand: 1: +2 Total assist;Patient percentage (comment);With upper  extremity assist;From chair/3-in-1 ((pt=85%)) Sit to Stand Details (indicate cue type and reason): Assist for balance with cues for hand placement.   Stand to Sit: 1: +2 Total assist;Patient percentage (comment);Without upper extremity assist;To bed ((pt=85%)) Stand to Sit Details: Assist to slow descent with cues for hand placement and sequence. Ambulation/Gait Ambulation/Gait: Yes Ambulation/Gait Assistance: 1: +2 Total assist;Patient percentage (comment) ((pt=85%)) Ambulation/Gait Assistance Details (indicate cue type and reason): Assist for balance with cues for tall posture.  Pt tolerated ambulation much better this pm, but distance still limited by pain. Ambulation Distance (Feet): 5 Feet Assistive device: 2 person hand held assist Gait Pattern: Decreased stride length Stairs: No Wheelchair Mobility Wheelchair Mobility: No  Posture/Postural Control Posture/Postural Control: No significant limitations Balance Balance Assessed: No End of Session PT - End of Session Equipment Utilized During Treatment: Gait belt;Back brace Activity Tolerance: Patient tolerated treatment well;Patient limited by pain Patient left: in bed;with call bell in reach (With Sitter.) Nurse Communication: Mobility status for transfers;Mobility status for ambulation General Behavior During Session: [facility] for tasks performed Cognition: Impaired Cognitive Impairment: Pt able to follow one-step commands with increased consistency this pm as well as had increased attention to sustained.  Pt does not, however, recall any events from this am.  Johnathan Ayala 05/14/2011, 1:44 PM  05/14/2011 Johnathan Ayala, PT, DPT [phone]

## 2011-05-14 NOTE — Op Note (Signed)
NAMELINDEL, MARCELL                 ACCOUNT NO.:  [id number]  MEDICAL RECORD NO.:  [id number]  LOCATION:  3304                         FACILITY:  [facility]  PHYSICIAN:  Estill Bamberg, MD      DATE OF BIRTH:  04-08-1960  DATE OF PROCEDURE:  05/13/2011 DATE OF DISCHARGE:                              OPERATIVE REPORT   Of note, this dictation serves as the posterior portion of the procedure.  The anterior portion was dictated earlier.  PREOPERATIVE DIAGNOSES: 1. Lumbar degenerative scoliosis. 2. L2-3, L3-4, L4-5 degenerative disk disease. 3. L2-3, L3-4, L4-5 spinal stenosis resulting in bilateral leg pain.  POSTOPERATIVE DIAGNOSES: 1. Lumbar degenerative scoliosis. 2. L2-3, L3-4, L4-5 degenerative disk disease. 3. L2-3, L3-4, L4-5 spinal stenosis resulting in bilateral leg pain.  PROCEDURES: 1. L2-3, L3-4, L4-5 posterior spinal fusion. 2. Placement of posterior segmental instrumentation, L2, L3, L4, L5. 3. Intraoperative use of fluoroscopy.  SURGEON:  Estill Bamberg, MD  ASSISTANT:  Laural Benes. Su Hilt, PA-C  ANESTHESIA:  General endotracheal anesthesia.  COMPLICATIONS:  None.  DISPOSITION:  Stable.  ESTIMATED BLOOD LOSS:  Minimal.  INDICATIONS FOR PROCEDURE:  Briefly, Mr. Sackrider is a pleasant 51 year old male who presented to me with bilateral leg pain and signs and symptoms associated with spinal stenosis.  I would defer to my previously dictated operative report for full discussion regarding the indications for the procedure, etc.  This dictation serves as the posterior portion of the procedure.  The anterior portion was dictated separately.  OPERATIVE DETAILS:  On May 13, 2011, I did go forward with an anterior lumbar interbody fusion x3 from the L2-L5 levels.  Again, that portion of the procedure was dictated on a separate operative report.  At the termination of that portion of the procedure, dressing was applied and the patient was placed prone on a well-padded  Jackson spinal frame.  The back was prepped and draped in usual sterile fashion.  I then brought in fluoroscopy and marked out the lateral border of the pedicles at the L2, L3, L4, L5 levels.  I then made an incision from approximately the pedicle of L2 to approximately the pedicle of L5.  A paramedian incision was made on both right and the left sides.  The fascia was incised.  I then placed a series of Jamshidi needles.  The L2, L3, L4 and L5 pedicles were cannulated in the usual fashion.  Guidewires were placed. I did liberally used both AP and lateral fluoroscopy to help confirm appropriate positioning of the screws and guidewires.  I did use a 6-mm tap at each pedicle to be instrumented.  I did use neurologic monitoring throughout the procedure.  Of particular note, there was no abnormal EMG findings identified.  I did test each of the taps as they were inserted and there was no tap that tested below 29 milliamps.  The taps were advanced to approximately the anterior 1/4 of the vertebral bodies.  I did feel that 7.5 x 50-mm screws would be appropriate at L4 and L5 and the 7 x 50-mm screws would be appropriate at L2 and L3.  This is per my preoperative templating.  The screw size  as noted above and placed in the usual fashion over the guidewires.  The guidewires were then removed.  I then chose a 120-mm rod for the left side and a 100-mm rod for the right side.  The rods were each placed subfascially in the usual fashion using a rod Passer.  I did go from inferior to superior while passing the rods.  Caps were then placed over each of the rods and screws.  A final locking procedure was performed over each of the caps. I then again obtained both AP and lateral fluoroscopy and I was very pleased with the final construct.  I then turned my attention towards the patient's right posterolateral gutter.  I did use a 3-mm high-speed bur to decorticate the facet joints associated with the L2-3,  L3-4 and the L4-5 levels.  The transverse processes were also decorticated and the remainder of the DBX mix allograft was packed in the posterolateral gutter and towards the facet joints on the right side, the levels were noted above.  Again, I obtained AP and lateral fluoroscopy and was very pleased with the final construct.  I did note excellent correction of the patient's previous lumbar degenerative scoliosis.  At this point, the wounds were copiously irrigated.  The fascia on both the right and left sides were closed using #1 Vicryl.  The subcutaneous layer was closed using 2-0 Vicryl and the skin was closed using 4-0 Monocryl. Benzoin and Steri-Strips were applied followed by 4x4s and a sterile dressing.  The patient was then awakened from general endotracheal anesthesia and transferred to recovery in stable condition.     Estill Bamberg, MD     MD/MEDQ  D:  05/13/2011  T:  05/14/2011  Job:  [id number]  cc:   Wannetta Sender., MD

## 2011-05-14 NOTE — Clinical Documentation Improvement (Signed)
CHANGE MENTAL STATUS DOCUMENTATION CLARIFICATION   THIS DOCUMENT IS NOT A PERMANENT PART OF THE MEDICAL RECORD  TO RESPOND TO THE THIS QUERY, FOLLOW THE INSTRUCTIONS BELOW:  1. If needed, update documentation for the patient's encounter via the notes activity.  2. Access this query again and click edit on the In [employer].  3. After updating, or not, click F2 to complete all highlighted (required) fields concerning your review. Select "additional documentation in the medical record" OR "no additional documentation provided".  4. Click Sign note button.  5. The deficiency will fall out of your In Basket *Please let us know if you are not able to complete this workflow by phone or e-mail (listed below).         05/14/11  Dear Dr. Karsten Ro Marton Redwood  In an effort to better capture your patient's severity of illness, reflect appropriate length of stay and utilization of resources, a review of the patient medical record has revealed the following indicators.    Based on your clinical judgment, please clarify and document in a progress note and/or discharge summary the clinical condition associated with the following supporting information:  In responding to this query please exercise your independent judgment.  The fact that a query is asked, does not imply that any particular answer is desired or expected.  Per note "patient's confusion likely due to anesthetic effects"  , please clarify if either of the following diagnosis would be appropriate.  Thank you     Possible Clinical Conditions?   _______Drug induced confusion  _______Acute confusion  _______Post operative confusion  _______Other Condition__________________  _______Cannot Clinically Determine    Supporting Information: s/p ant./ post. fusion  Risk Factors: age, h/o mental disorder  Signs & Symptoms: agitation noted post op   Treatment: "d/c PCA and withhold narcotics" /  Valium continued   Reviewed:  additional documentation in the medical record  Thank You,  Leonette Most Addison  Clinical Documentation Specialist RN, BSN:  Pager [phone] HIM [phone]  Health Information Management [place]

## 2011-05-14 NOTE — Plan of Care (Signed)
Problem: Consults Goal: Diagnosis - Spinal Surgery Outcome: Completed/Met Date Met:  05/14/11 Thoraco/Lumbar Spine Fusion

## 2011-05-14 NOTE — Progress Notes (Signed)
Patient agitated throughout the night.  Improved currently per nurse and wife.  AVSS NVI grossly but hard to precisely assess strength due to confusion Dressing CDI Patient confused.  Does look up and interact, but unaware of person, place or situation.   POD #1 after L2-5 A/P fusion with significant agitation  - patient's confusion likely due to anesthetic effects, but I do want to consult a medicine team for additional management and workup as they see fit - up with PT today, would really like to have patient ambulate today if at all possible - d/c PCA and withold narcotics for now - continue valiium

## 2011-05-14 NOTE — Consult Note (Signed)
Reason for Consult: Agitation/confusion, and medical management.  Referring Physician:  Dr. Yevette Edwards  HPI:  Johnathan Ayala is an 51 y.o. Male. with past medical history significant for depression, "mental disorder", hypertension coronary artery disease, who was admitted to orthopedics/Dr. Dumonski's service on 4/4 with bilateral leg pain and underwent anterior lateral lumbar fusion L2-5 swallows posterior lumbar fusion L2-5-prolonged surgery per Dr. Yevette Edwards but without complications. Overnight the patient became agitated confused, and so medicine was consulted for further management as appropriate. Her family and nursing staff patient only to 200 mg of his Clozaril last PM(instead of his usual 400 mg because that is all he agreed to take by mouth). He was given Valium for the agitation and that seemed to help calm him down some by this morning. He denies any other complaints. Wife is at the bedside assisting with the history, and states that she is used to this happening never he is  in the hospital had surgeries in the past, or missed his medications.  Past Medical History  Diagnosis Date  . Hypertension   . Pneumonia 2012  . Sleep apnea     study done 7-9 years ago . uses  c-pap  . Neuromuscular disorder   . Mental disorder   . Depression   . Coronary artery disease     [facility] (Dr. Christella Noa)     Family History  Problem Relation Age of Onset  . Anesthesia problems Neg Hx   . Hypotension Neg Hx   . Malignant hyperthermia Neg Hx   . Pseudochol deficiency Neg Hx     Social History:  reports that he has been smoking Cigarettes.  He has been smoking about 1.5 packs per day. He does not have any smokeless tobacco history on file. He reports that he does not drink alcohol or use illicit drugs.  Allergies: No Known Allergies  Medications:   Results for orders placed during the hospital encounter of 05/13/11 (from the past 48 hour(s))  BASIC METABOLIC PANEL     Status: Abnormal   Collection Time   05/14/11  8:31 AM      Component Value Range Comment   Sodium 138  135 - 145 (mEq/L)    Potassium 3.8  3.5 - 5.1 (mEq/L)    Chloride 105  96 - 112 (mEq/L)    CO2 22  19 - 32 (mEq/L)    Glucose, Bld 132 (*) 70 - 99 (mg/dL)    BUN 10  6 - 23 (mg/dL)    Creatinine, Ser 4.54  0.50 - 1.35 (mg/dL)    Calcium 8.9  8.4 - 10.5 (mg/dL)    GFR calc non Af Amer >90  >90 (mL/min)    GFR calc Af Amer >90  >90 (mL/min)     Dg Chest 2 View  05/13/2011  *RADIOLOGY REPORT*  Clinical Data: Preop spinal fusion.  Bilateral leg pain. Hypertension and smoker.  CHEST - 2 VIEW  Comparison: 03/20/2010  Findings: Shallow inspiration.  Normal heart size and pulmonary vascularity.  Peribronchial thickening and perihilar interstitial change suggesting chronic bronchitis.  No focal airspace consolidation.  Mild blunting of costophrenic angles, stable since previous study.  This could represent small effusions or pleural thickening.  No pneumothorax.  IMPRESSION: Shallow inspiration.  Pleural effusion or thickening in the lung bases.  Chronic bronchitic changes.  No focal consolidation.  Original Report Authenticated By: Marlon Pel, M.D.   Dg Lumbar Spine 2-3 Views  05/13/2011  *RADIOLOGY REPORT*  Clinical Data:  Posterior fusion L2-L5.  LUMBAR SPINE - 2-3 VIEW  Comparison: MRI 02/26/2010  Findings: Two intraoperative spot images demonstrate changes of posterior fusion from L2-L5.  Normal alignment.  No hardware complicating feature.  IMPRESSION: L2-L5 posterior fusion.  Original Report Authenticated By: Cyndie Chime, M.D.   Dg C-arm Gt 120 Min  05/13/2011  *RADIOLOGY REPORT*  Clinical Data: Posterior fusion L2-L5.  LUMBAR SPINE - 2-3 VIEW  Comparison: MRI 02/26/2010  Findings: Two intraoperative spot images demonstrate changes of posterior fusion from L2-L5.  Normal alignment.  No hardware complicating feature.  IMPRESSION: L2-L5 posterior fusion.  Original Report Authenticated By: Cyndie Chime, M.D.      Constitutional: Negative for fever, chills, weight loss, malaise/fatigue and diaphoresis.  HENT: Negative for hearing loss, nosebleeds, congestion.   Respiratory: Negative for cough, hemoptysis, sputum production, shortness of breath, wheezing and stridor.   Cardiovascular: Negative for chest pain, palpitations, orthopnea, claudication, leg swelling and PND.  Gastrointestinal: Negative for heartburn, nausea, vomiting, abdominal pain, diarrhea, constipation, blood in stool and melena.  Genitourinary: Negative for dysuria, urgency, frequency, hematuria and flank pain.  Musculoskeletal: Negative for myalgias, back pain, joint pain and falls.  Skin: Negative for rash.  Neurological: , focal weakness, seizures, weakness and headaches.   Blood pressure 112/62, pulse 95, temperature 99.1 F (37.3 C), temperature source Oral, resp. rate 26, height 6' (1.829 m), weight 113 kg (249 lb 1.9 oz), SpO2 92.00%.  General Appearance:    Alert,oriented x2  cooperative, no distress, appears stated age  Lungs:     Clear to auscultation bilaterally, respirations unlabored   Heart:    Regular rate and rhythm, S1 and S2 normal, no murmur, rub   or gallop  Abdomen:     Soft, non-tender, bowel sounds active all four quadrants,    no masses, no organomegaly  Extremities:   Extremities normal, atraumatic, no cyanosis or edema  Neurologic:   CNII-XII intact, nonfocal.      Assessment/Plan: Active Problems: 1. Agitation/confusion/postop delirium -Likely contributing factors effects of anesthesia, narcotics, and missed medication doses -Resume his outpatient medications as scheduled, continue when necessary valium since it since it has helped so far. -Will obtain UA to evaluate for infection, his chest x-ray from 4/4 showed no acute infiltrates. -Follow and if does not continue to improve we'll recommend psychiatry consultation 2. Depression -Continuing Prozac and outpatient medications as ordered. 3.  Hypertension -Continue atenolol, monitor blood pressures and further treat accordingly.  4.history of coronary artery disease -chest pain free, continue his outpatient medications.    Thank you for allowing [place] to participate in the care of this patient, will follow with you.  Kela Millin 05/14/2011, 9:43 AM  [facility] hospitalist   [phone]

## 2011-05-14 NOTE — Progress Notes (Signed)
Physical Therapy Evaluation Patient Details Name: Johnathan Ayala MRN: [id number] DOB: 06-25-1960 Today's Date: 05/14/2011  Problem List: There is no problem list on file for this patient.   Past Medical History:  Past Medical History  Diagnosis Date  . Hypertension   . Pneumonia 2012  . Sleep apnea     study done 7-9 years ago . uses  c-pap  . Neuromuscular disorder   . Mental disorder   . Depression   . Coronary artery disease     [facility] (Dr. Christella Noa)   Past Surgical History:  Past Surgical History  Procedure Date  . Appendectomy   . Back surgery 2010  . Tonsillectomy   . Knee arthroscopy 2 yrs ago.     right   . Coronary angioplasty     [facility] '03    PT Assessment/Plan/Recommendation PT Assessment Clinical Impression Statement: Pt is a 51 y/o male admitted s/p L2-5 fusion along with the below PT problem list.  Pt would benefit from acute PT to maximize independence as well as tolerance to mobility and facilitate d/c home with HHPT.  Pt limited significantly by pain this am stating "Oh my God" repeatedly.  Decreased attention as well as difficulty consistently following commands also limiting treatment.  Co-treatment with OT. PT Recommendation/Assessment: Patient will need skilled PT in the acute care venue PT Problem List: Decreased strength;Decreased activity tolerance;Decreased balance;Decreased mobility;Decreased knowledge of use of DME;Decreased knowledge of precautions;Pain Barriers to Discharge: None PT Therapy Diagnosis : Difficulty walking;Acute pain PT Plan PT Frequency: Min 5X/week PT Treatment/Interventions: DME instruction;Gait training;Stair training;Functional mobility training;Therapeutic activities;Balance training;Patient/family education PT Recommendation Follow Up Recommendations: Home health PT Equipment Recommended: None recommended by PT PT Goals  Acute Rehab PT Goals PT Goal Formulation: With patient/family Time For Goal Achievement:  7 days Pt will Roll Supine to Right Side: with modified independence PT Goal: Rolling Supine to Right Side - Progress: Goal set today Pt will Roll Supine to Left Side: with modified independence PT Goal: Rolling Supine to Left Side - Progress: Goal set today Pt will go Supine/Side to Sit: with modified independence PT Goal: Supine/Side to Sit - Progress: Goal set today Pt will go Sit to Supine/Side: with modified independence PT Goal: Sit to Supine/Side - Progress: Goal set today Pt will go Sit to Stand: with modified independence PT Goal: Sit to Stand - Progress: Goal set today Pt will go Stand to Sit: with modified independence PT Goal: Stand to Sit - Progress: Goal set today Pt will Ambulate: >150 feet;with modified independence;with least restrictive assistive device PT Goal: Ambulate - Progress: Goal set today Pt will Go Up / Down Stairs: Flight;with min assist;with least restrictive assistive device PT Goal: Up/Down Stairs - Progress: Goal set today  PT Evaluation Precautions/Restrictions  Precautions Precautions: Back Precaution Booklet Issued: Yes (comment) Precaution Comments: provided handout with education to pt and wife Required Braces or Orthoses: Yes Spinal Brace: Thoracolumbosacral orthotic;Other (comment);Applied in sitting position (with leg extension) Spinal Brace Comments: Pt required total (A) +2 to don due to pt unable to follow simple commands Restrictions Weight Bearing Restrictions: No Prior Functioning  Home Living Lives With: Spouse Type of Home: House Home Layout: Two level Alternate Level Stairs-Rails: Right Alternate Level Stairs-Number of Steps: 12-15 (standard flight) Home Access: Stairs to enter Entrance Stairs-Rails: None Entrance Stairs-Number of Steps: 3 Bathroom Shower/Tub: Psychologist, counselling;Door [employer] Toilet: Standard Bathroom Accessibility: Yes How Accessible: Accessible via walker Home Adaptive Equipment: Crutches;Bedside  commode/3-in-1;Walker - rolling;Straight  cane;Reacher;Long-handled sponge Prior Function Level of Independence: Independent with basic ADLs;Independent with gait;Independent with transfers Able to Take Stairs?: Yes Driving: Yes 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, maintenance (IT) Arousal/Alertness: Awake/alert Overall Cognitive Status: Impaired Attention: Impaired Current Attention Level: Focused Orientation Level: Oriented to person (max v/c) Following Commands: Follows one step commands inconsistently Safety/Judgement: Decreased awareness of safety precautions;Decreased safety judgement for tasks assessed Decreased Safety/Judgement: Decreased awareness of need for assistance Awareness of Errors: Decreased awareness of errors made Decreased Awareness of Errors: Assistance required to identify errors made Awareness of Deficits: Decreased awareness of deficits Problem Solving: Requires assistance for problem solving Sensation/Coordination Sensation Light Touch: Impaired by gross assessment;Impaired Detail Light Touch Impaired Details: Impaired RLE;Impaired LLE Stereognosis: Not tested Hot/Cold: Not tested Proprioception: Not tested Coordination Gross Motor Movements are Fluid and Coordinated: Yes Fine Motor Movements are Fluid and Coordinated: No Extremity Assessment RUE Assessment RUE Assessment: Not tested LUE Assessment LUE Assessment: Not tested RLE Assessment RLE Assessment: Within Functional Limits LLE Assessment LLE Assessment: Within Functional Limits Pain 7/10 in back.  Pt premedicated and repositioned. Mobility (including Balance) Bed Mobility Bed Mobility: Yes Rolling Left: 1: +2 Total assist;Patient percentage (comment);With rail ((pt=75%)) Rolling Left Details (indicate cue type and reason): Assist to cue and guide right UE to rail as well as flex right LE in preparation for rolling.   Cues/facilitation to pelvis during rolling. Left Sidelying to Sit: 1: +2 Total assist;Patient percentage (comment);HOB flat ((pt=75%)) Left Sidelying to Sit Details (indicate cue type and reason): Assist to facilitate motion of bilateral LEs off EOB as well as translate trunk right to midline.  Cues for sequence. Transfers Transfers: Yes Sit to Stand: 1: +2 Total assist;Patient percentage (comment);With upper extremity assist;From bed ((pt=75%)) Sit to Stand Details (indicate cue type and reason): Assist to translate trunk anterior over BOS with max cues for safest hand placement.   Stand to Sit: 1: +2 Total assist;Patient percentage (comment);With upper extremity assist;To chair/3-in-1 ((pt=75%)) Stand to Sit Details: Assist to slow descent of trunk to chair with max cues for sequence as well as facilitation to initiate descent. Ambulation/Gait Ambulation/Gait: Yes Ambulation/Gait Assistance: 1: +2 Total assist;Patient percentage (comment) ((pt=75%)) Ambulation/Gait Assistance Details (indicate cue type and reason): Assist for balance and to facilitate weight shifting to advance bilateral LEs.  Distance limited by pain and attention to task.  Max cues for sequence. Ambulation Distance (Feet): 2 Feet Assistive device: 2 person hand held assist Gait Pattern: Decreased stride length Stairs: No Wheelchair Mobility Wheelchair Mobility: No  Posture/Postural Control Posture/Postural Control: No significant limitations Balance Balance Assessed: No End of Session PT - End of Session Equipment Utilized During Treatment: Gait belt;Back brace Activity Tolerance: Patient tolerated treatment well;Patient limited by pain Patient left: in chair;with call bell in reach;with family/visitor present Psychiatrist present) Nurse Communication: Mobility status for transfers;Mobility status for ambulation General Behavior During Session: Lethargic Cognition: Impaired Cognitive Impairment: Significant difficulty  following commands consistently as well as with attention (only at focused).  Johnathan Ayala 05/14/2011, 11:15 AM  05/14/2011 Johnathan Ayala, PT, DPT [phone]

## 2011-05-14 NOTE — Evaluation (Signed)
Occupational Therapy Evaluation Patient Details Name: Johnathan Ayala MRN: [id number] DOB: 20-Jun-1960 Today's Date: 05/14/2011  Problem List: There is no problem list on file for this patient.   Past Medical History:  Past Medical History  Diagnosis Date  . Hypertension   . Pneumonia 2012  . Sleep apnea     study done 7-9 years ago . uses  c-pap  . Neuromuscular disorder   . Mental disorder   . Depression   . Coronary artery disease     [facility] (Dr. Christella Noa)   Past Surgical History:  Past Surgical History  Procedure Date  . Appendectomy   . Back surgery 2010  . Tonsillectomy   . Knee arthroscopy 2 yrs ago.     right   . Coronary angioplasty     [facility] '03    OT Assessment/Plan/Recommendation OT Assessment Clinical Impression Statement: 51 yo male s/p  L2-5 fusion that could benefit from skilled OT acutely. Recommend HHOT for d/c planning.  OT Recommendation/Assessment: Patient will need skilled OT in the acute care venue OT Problem List: Decreased strength;Decreased activity tolerance;Impaired balance (sitting and/or standing);Decreased coordination;Decreased cognition;Decreased safety awareness;Decreased knowledge of use of DME or AE;Decreased knowledge of precautions;Pain OT Therapy Diagnosis : Generalized weakness;Acute pain OT Plan OT Frequency: Min 2X/week OT Treatment/Interventions: Self-care/ADL training;DME and/or AE instruction;Therapeutic activities;Cognitive remediation/compensation;Patient/family education;Balance training OT Recommendation Follow Up Recommendations: Home health OT Equipment Recommended: None recommended by PT Individuals Consulted Consulted and Agree with Results and Recommendations: Family member/caregiver;Patient OT Goals Acute Rehab OT Goals OT Goal Formulation: With patient/family Time For Goal Achievement: 2 weeks ADL Goals Pt Will Perform Grooming: with set-up;Sitting, chair;Supported ADL Goal: Grooming - Progress: Goal  set today Pt Will Perform Upper Body Bathing: with min assist;Supine, head of bed up ADL Goal: Upper Body Bathing - Progress: Goal set today Pt Will Perform Upper Body Dressing: with min assist;Sitting, chair;Unsupported ADL Goal: Upper Body Dressing - Progress: Goal set today Pt Will Transfer to Toilet: with mod assist;3-in-1 ADL Goal: Toilet Transfer - Progress: Goal set today Pt Will Perform Toileting - Clothing Manipulation: with mod assist;Sitting on 3-in-1 or toilet ADL Goal: Toileting - Clothing Manipulation - Progress: Goal set today Pt Will Perform Toileting - Hygiene: with mod assist;Standing at 3-in-1/toilet ADL Goal: Toileting - Hygiene - Progress: Goal set today Miscellaneous OT Goals Miscellaneous OT Goal #1: PT will don / doff brace with wife Mod I as precursor to adls OT Goal: Miscellaneous Goal #1 - Progress: Goal set today Miscellaneous OT Goal #2: Pt will verbalize 3 out 3 back precautions and demonstrate 100 % correct bed mobility as precurose to adls OT Goal: Miscellaneous Goal #2 - Progress: Goal set today  OT Evaluation Precautions/Restrictions  Precautions Precautions: Back Precaution Booklet Issued: Yes (comment) Precaution Comments: provided handout with education to pt and wife Required Braces or Orthoses: Yes Spinal Brace: Thoracolumbosacral orthotic;Other (comment);Applied in sitting position (with leg extension) Spinal Brace Comments: Pt required total (A) +2 to don due to pt unable to follow simple commands Restrictions Weight Bearing Restrictions: No Prior Functioning Home Living Lives With: Spouse Type of Home: House Home Layout: Two level Alternate Level Stairs-Rails: Right Alternate Level Stairs-Number of Steps: 12-15 (standard flight) Home Access: Stairs to enter Entrance Stairs-Rails: None Entrance Stairs-Number of Steps: 3 Bathroom Shower/Tub: Psychologist, counselling;Door [employer] Toilet: Standard Bathroom Accessibility: Yes How Accessible:  Accessible via walker Home Adaptive Equipment: Crutches;Bedside commode/3-in-1;Walker - rolling;Straight cane;Reacher;Long-handled sponge Prior Function Level of Independence: Independent with basic ADLs;Independent  with gait;Independent with transfers Able to Take Stairs?: Yes Driving: Yes 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 Pt very distracted by verbalizing to self constantly.  Where Assessed - Grooming: Sitting, chair;Supported Lower Body Bathing: Simulated;+2 Total assistance Where Assessed - Lower Body Bathing: Supine, head of bed up Upper Body Dressing: Performed;+1 Total assistance Where Assessed - Upper Body Dressing: Supine, head of bed up Toilet Transfer: Simulated;+2 Total assistance;Comment for patient % (50% ) Toilet Transfer Details (indicate cue type and reason): pt self distracted by verbalizing out loud to self. Pt required constant redirection. Pt responding best to name call and direct command "Afton sit in the chair" due to decreased cognition Toilet Transfer Method: Stand pivot Toilet Transfer Equipment: Raised toilet seat with arms (or 3-in-1 over toilet) Ambulation Related to ADLs: Pt side stepping to chair at bed side and ambulation limited by pt's current cognition.  ADL Comments: Pt log rolled to the Lt side with tactile input and max v/c. Pt following simple commands 75% of session with max repetition. Pt poor demo of edcuation and no recall of back precautions. Vision/Perception  Vision -  History Baseline Vision: No visual deficits Patient Visual Report: No change from baseline Cognition Cognition Arousal/Alertness: Awake/alert Overall Cognitive Status: Impaired Attention: Impaired Current Attention Level: Focused Orientation Level: Oriented to person (max v/c) Following Commands: Follows one step commands inconsistently Safety/Judgement: Decreased awareness of safety precautions;Decreased safety judgement for tasks assessed Decreased Safety/Judgement: Decreased awareness of need for assistance Awareness of Errors: Decreased awareness of errors made Decreased Awareness of Errors: Assistance required to identify errors made Awareness of Deficits: Decreased awareness of deficits Problem Solving: Requires assistance for problem solving Sensation/Coordination Sensation Light Touch: Impaired by gross assessment;Impaired Detail Light Touch Impaired Details: Impaired RLE;Impaired LLE Stereognosis: Not tested Hot/Cold: Not tested Proprioception: Not tested Coordination Gross Motor Movements are Fluid and Coordinated: Yes Fine Motor Movements are Fluid and Coordinated: No Extremity Assessment RUE Assessment RUE Assessment: Not tested LUE Assessment LUE Assessment: Not tested Mobility  Bed Mobility Bed Mobility: Yes Rolling Left: 1: +2 Total assist;Patient percentage (comment);With rail ((pt=75%)) Rolling Left Details (indicate cue type and reason): Assist to cue and guide right UE to rail as well as flex right LE in preparation for rolling.  Cues/facilitation to pelvis during rolling. Left Sidelying to Sit: 1: +2 Total assist;Patient percentage (comment);HOB flat ((pt=75%)) Left Sidelying to Sit Details (indicate cue type and reason): Assist to facilitate motion of bilateral LEs off EOB as well as translate trunk right to midline.  Cues for sequence. Transfers Transfers: Yes Sit to Stand: 1: +2 Total assist;Patient percentage (comment);With upper extremity assist;From  bed ((pt=75%)) Sit to Stand Details (indicate cue type and reason): Assist to translate trunk anterior over BOS with max cues for safest hand placement.   Stand to Sit: 1: +2 Total assist;Patient percentage (comment);With upper extremity assist;To chair/3-in-1 ((pt=75%)) Stand to Sit Details: Assist to slow descent of trunk to chair with max cues for sequence as well as facilitation to initiate descent. Exercises   End of Session OT - End of Session Equipment Utilized During Treatment: Gait belt;Back brace Activity Tolerance: Patient tolerated  treatment well;Other (comment) (cognition deficits) Patient left: in chair;with call bell in reach;with family/visitor present (wife) Nurse Communication: Mobility status for transfers;Mobility status for ambulation General Behavior During Session: Lethargic Cognition: Impaired Cognitive Impairment: Significant difficulty following commands consistently as well as with attention (only at focused).   Harrel Carina [facility] 05/14/2011, 11:41 AM  Pager: [phone]

## 2011-05-14 NOTE — Op Note (Signed)
Johnathan Ayala, Johnathan Ayala                 ACCOUNT NO.:  [id number]  MEDICAL RECORD NO.:  [id number]  LOCATION:  3304                         FACILITY:  [facility]  PHYSICIAN:  Estill Bamberg, MD      DATE OF BIRTH:  1960-08-10  DATE OF PROCEDURE:  05/13/2011 DATE OF DISCHARGE:                              OPERATIVE REPORT  This dictation serves as the dictation for the anterior portion of the procedure.  The posterior portion will be dictated separately.   PREOPERATIVE DIAGNOSES: 1. Bilateral lumbar radiculopathy secondary to lumbar spinal stenosis. 2. L2-3, L3-4, L4-5 degenerative disk disease. 3. Lumbar degenerative scoliosis. 4. Status post L3-4 decompression.  POSTOPERATIVE DIAGNOSES: 1. Bilateral lumbar radiculopathy secondary to lumbar spinal stenosis. 2. L2-3, L3-4, L4-5 degenerative disk disease. 3. Lumbar degenerative scoliosis. 4. Status post L3-4 decompression.  PROCEDURE: 1. Anterior lumbar interbody fusion via a lateral approach x3 (L2-3,     L3-4, L4-5). 2. Placement of interbody device x3 (DePuy lateral interbody cage). 3. Use of morselized allograft (DBX mix). 4. Intraoperative use of fluoroscopy. 5. (This was an anterior/posterior procedure and the posterior aspect     of the procedure will be dictated on a separate operative note).  SURGEON:  Estill Bamberg, MD  ASSISTANT:  None.  ANESTHESIA:  General endotracheal anesthesia.  COMPLICATIONS:  None.  DISPOSITION:  Stable.  ESTIMATED BLOOD LOSS:  Minimal.  INDICATIONS FOR PROCEDURE:  Briefly, Mr. Braddy is a very pleasant 51- year-old male who initially presented to me with severe bilateral pain involving both the right and the left legs.  We did go forward with extensive forms of conservative care, and the patient continued to have pain.  Of particular note, the patient did have a degenerative lumbar scoliotic curve involving the lumbar spine with angular deformities identified at L2-3, L3-4, and L4-5.  Also of  note, the patient is status post an L3-4 decompression.  Also of note, the patient was noted to have varying degrees of stenosis involving the L2-3, L3-4 and L4-5 levels. Given the patient's ongoing symptomatology despite extensive forms of conservative care, we did discuss operative intervention.  We did discuss the risks and benefits regarding a traditional open posterior decompression and fusion versus an anterior lumbar interbody fusion via a direct lateral approach.  We did ultimately decide to go forward with a lateral interbody fusion followed by posterior spinal fusion and instrumentation.  Again, the posterior aspect of the procedure  will be dictated in an operative report.  Of particular note, the patient did clearly understand the unique risks associated with a direct lateral approach.  Those specifically included injury to the nerves involving the lumbar plexus, a right-sided approach was to be utilized and the patient did understand that this could result in temporary or permanent right-sided nerve dysfunction which can result in numbness, weakness, or pain.  The patient also understood the risks of a nonunion, adjacent segment disease, and the other risks outlined in my preoperative note.  OPERATIVE DETAILS:  O n May 13, 2011, the patient was brought to surgery and placed in the lateral decubitus position with the right side towards the ceiling.  The legs and chest were strapped  to the bed in the usual fashion.  A break was made in the table to ensure adequate exposure of the L4-5 interspace.  I then brought in AP and lateral fluoroscopy to confirm that I was able to get a good look at the disks at the L2-3, L3-4, and L4-5 levels on both the AP and lateral plane.  At this point, the wound was prepped and draped in usual sterile fashion. A 1 g of vancomycin was given.  Time-out procedure was performed and SCDs were placed.  The  right flank was prepped and draped in the  usual sterile fashion.  I then made a transverse incision over the L4-5 interspace.  I then bluntly dissected through the external then the internal oblique musculature.  The transversalis fascia was then entered and the retroperitoneal fat was readily encountered.  Using a finger, I did bluntly sweep the peritoneum anteriorly, and I did place the initial dilator over the L4-5 interspace.  Of note, I did ensure that there was no peritoneum or intra-abdominal contents encountered.  Of note, I did liberally use free running EMG throughout the procedure.  A neurologic monitoring was used throughout the procedure including spontaneous SSEPs.  I then sequentially placed dilators over the L4-5 interspace. Again, I did use free running EMG to ensure that there was no neurologic structure anywhere about the dilators.  Once the final dilator was placed, I was able to confirm with neurologic monitoring that there was no nerve about the dilator.  I then secured the retractor to the bed over the dilators.  I then removed the dilators and I did readily visualized the L4-5 interspace.  There were no neurologic structures noted.  I did note on both the AP and lateral views that the dilators in the appropriate position.  I then used a #10 knife to perform an annulotomy over the right side of the L4-5 intervertebral space.  I then used a series of spreaders and scrapers and curettes to perform a thorough and complete diskectomy.  I did ensure that there was no violation of either endplate involving into the L4 or the L5 level.  I then placed a series of trials, and did feel that an 18 x 55 x 12 mm trial would be the most appropriate fit.  I was very happy with both the AP and lateral views noted on the fluoroscopic images.  I did note excellent correction across the L4-5 interspace as well.  The interbody device was then packed with a liberal amount of DBX mix.  The implant was tamped into position using  both AP and lateral fluoroscopy.  Again, I was very pleased with the final press fit and the final appearance of the implant.  I then removed the retractors and did explore the wound as retractors were being removed to ensure that there is no undue bleeding. I then made a second transverse incision midway between the L2-3 and the L3-4 interspaces.  I again dissected through the external and the internal oblique, and then I again entered through the transversalis fascia.  The retroperitoneal space was again encountered.  I then docked the initial dilator over the L3-4 interspace and placed a guidewire.  I then placed a series of dilators in the manner described previously.  I again went forward with a diskectomy in the manner described previously at the L3-4 interspace.  At this particular level, an 18 x 55 x 10 mm graft was selected and filled with allograft.  The  graft was tamped into position.  Again, I did note an excellent press fit and I did note wonderful correction of the scoliotic deformity.  The L3-4 graft was packed with allograft and tamped into position in the usual fashion.  I was very happy with the final press fit and the final appearance.  I again removed the dilators and the retractors.  Again, there was no abnormal responses using free running EMG, as per the neurologic monitoring team.  I then turned my attention towards the L2-3 interspace.  The interspace was again approached in the manner described previously.  I did use the same skin incision that was used for the L3-4 interspace.  I again placed dilators sequentially.  I again liberally used neurologic monitoring to ensure that there was no impingement of any neurologic structures.  I again liberally used AP and lateral fluoroscopy.  Once the dilators were docked over the L2-3 interspace, I used a #10 blade to perform an annulotomy and again, diskectomy was performed in the manner described previously.  Of  particular note, this particular level was rather difficult to access given the patient's rib cage.  I was, however, able to gain access to the intervertebral space, however was rather difficult, given the ribs.  I ultimately, however, was able to perform a diskectomy in the manner described previously. Once the diskectomy was completed, I did place a series of trials and I did feel that a 55 x 12 x 18 mm trial will be the most appropriate fit. This was again packed with allograft, and tamped into position in the usual fashion.  I was very happy with the final AP and lateral radiographs.  There was a slight degree of violation of the upper L3 endplate.  This evaluation was rather difficult exposure given the patient's rib cage obscuring access to the L2-3 interspace.  However, I did feel that the interbody graft was appropriately positioned and there was an excellent press fit that I noted when placing the graft.  At this point, the wounds were copiously irrigated.  The fascia was closed using #1 Vicryl and the subcutaneous layer was closed using 2-0 Vicryl.  The skin was closed using 3-0 Monocryl.  All instrument counts were correct at the termination of the procedure.  Benzoin and Steri-Strips followed by a sterile dressing was applied.  The patient was then placed into a prone position in anticipation of the posterior aspect of the procedure. That aspect of the procedure will be dictated on a separate operative report.     Estill Bamberg, MD     MD/MEDQ  D:  05/13/2011  T:  05/14/2011  Job:  [id number]

## 2011-05-14 NOTE — Progress Notes (Signed)
Throughout the night, patient has been anxious and agitated.  Hard to get him to follow commands; just says "Oh God and had tremors along with respirations being increased."  He is worse at times.  Was only able to get patient to take 200 mg of his Clozapin.  Called PA on call for [facility]; received orders to give IV 5 mg Valium q6 prn.  Gave Valium at 00:09.  Calmed the tremors and respirations lowered.  A few hours later, patient tried to get out of bed, pulled out his IV, and started yelling curse words.  Obtained an order for a Posey soft waist belt.  Wife arrived since and has helped calm patient.  Will continue to monitor patient closely.   Vivi Martens, RN

## 2011-05-15 LAB — CBC
Hemoglobin: 11.7 g/dL — ABNORMAL LOW (ref 13.0–17.0)
RBC: 3.88 MIL/uL — ABNORMAL LOW (ref 4.22–5.81)

## 2011-05-15 MED ORDER — HYDROCODONE-ACETAMINOPHEN 7.5-325 MG PO TABS: 1.0000 | ORAL_TABLET | ORAL | Status: DC | PRN

## 2011-05-15 MED ORDER — HYDROCODONE-ACETAMINOPHEN 7.5-325 MG PO TABS: 1.0000 | ORAL_TABLET | ORAL | Status: AC | PRN

## 2011-05-15 NOTE — Progress Notes (Signed)
Subjective: Pt aler and appropriate, denies any c/o. States he slept well last pm. Objective: Vital signs in last 24 hours: Temp:  [97.4 F (36.3 C)-98.6 F (37 C)] 97.6 F (36.4 C) (04/06 0706) Pulse Rate:  [68-85] 82  (04/06 0706) Resp:  [16-22] 20  (04/06 0706) BP: (109-141)/(62-75) 118/65 mmHg (04/06 0706) SpO2:  [91 %-96 %] 92 % (04/06 0706)   Intake/Output from previous day: 04/05 0701 - 04/06 0700 In: 2000 [P.O.:1200; I.V.:800] Out: 5750 [Urine:5750] Intake/Output this shift:      General Appearance:    Alert orientedx3 and appropriate, cooperative, no distress, appears stated age  Lungs:     Clear to auscultation bilaterally, respirations unlabored   Heart:    Regular rate and rhythm, S1 and S2 normal, no murmur, rub   or gallop  Abdomen:     Soft, non-tender, bowel sounds active all four quadrants,    no masses, no organomegaly  Extremities:   Extremities normal, atraumatic, no cyanosis or edema  Neurologic:   nonfocal    Weight change:   Intake/Output Summary (Last 24 hours) at 05/15/11 0934 Last data filed at 05/15/11 0414  Gross per 24 hour  Intake   1360 ml  Output   4020 ml  Net  -2660 ml    Lab Results:   Basename 05/14/11 0831  NA 138  K 3.8  CL 105  CO2 22  GLUCOSE 132*  BUN 10  CREATININE 0.80  CALCIUM 8.9    Basename 05/15/11 0500  WBC 11.0*  HGB 11.7*  HCT 33.1*  PLT 148*  MCV 85.3   PT/INR No results found for this basename: LABPROT:2,INR:2 in the last 72 hours ABG No results found for this basename: PHART:2,PCO2:2,PO2:2,HCO3:2 in the last 72 hours  Micro Results: No results found for this or any previous visit (from the past 240 hour(s)). Studies/Results: Dg Lumbar Spine 2-3 Views  05/13/2011  *RADIOLOGY REPORT*  Clinical Data: Posterior fusion L2-L5.  LUMBAR SPINE - 2-3 VIEW  Comparison: MRI 02/26/2010  Findings: Two intraoperative spot images demonstrate changes of posterior fusion from L2-L5.  Normal alignment.  No  hardware complicating feature.  IMPRESSION: L2-L5 posterior fusion.  Original Report Authenticated By: Cyndie Chime, M.D.   Dg C-arm Gt 120 Min  05/13/2011  *RADIOLOGY REPORT*  Clinical Data: Posterior fusion L2-L5.  LUMBAR SPINE - 2-3 VIEW  Comparison: MRI 02/26/2010  Findings: Two intraoperative spot images demonstrate changes of posterior fusion from L2-L5.  Normal alignment.  No hardware complicating feature.  IMPRESSION: L2-L5 posterior fusion.  Original Report Authenticated By: Cyndie Chime, M.D.   Medications:  Scheduled Meds:   . atenolol  50 mg Oral Daily  . Chlorhexidine Gluconate Cloth  6 each Topical Q0600  . cloZAPine  400 mg Oral QHS  . diazepam      . diazepam  5 mg Oral Q6H  . docusate sodium  100 mg Oral BID  . famotidine  20 mg Oral Daily  . FLUoxetine  60 mg Oral Daily  . hydrOXYzine  50 mg Oral BID  . lamoTRIgine  200 mg Oral BID  . mulitivitamin with minerals  1 tablet Oral Daily  . mupirocin ointment  1 application Nasal BID  . niacin  500 mg Oral QHS  . oxyCODONE  20 mg Oral Q12H  . senna  1 tablet Oral BID  . sodium chloride  3 mL Intravenous Q12H  . traMADol  50 mg Oral Q4H   Continuous Infusions:   .  sodium chloride    . 0.9 % NaCl with KCl 20 mEq / L 80 mL/hr at 05/14/11 1700   PRN Meds:.acetaminophen, acetaminophen, alum & mag hydroxide-simeth, diazepam, diphenhydrAMINE, diphenhydrAMINE, HYDROcodone-acetaminophen, menthol-cetylpyridinium, morphine, naloxone, ondansetron (ZOFRAN) IV, phenol, sodium chloride, zolpidem, DISCONTD: oxyCODONE-acetaminophen Assessment/Plan: Active Problems:  1. Agitation/confusion/postop delirium  -Resolved, Likely contributing factors effects of anesthesia, narcotics, and missed medication doses  -continue outpatient medications as scheduled, continue when necessary valium since it since it has helped so far. -UA neg for infection  2. Depression  -Continue Prozac and outpatient medications as ordered.  3. Hypertension    -ok control,Continue atenolol.  4.history of coronary artery disease -chest pain free, continue his outpatient medications.   Pt's post-op delirium, resolved, BP controlled will S/O. Pt to f/u with PCP, Please call as needed   LOS: 2 days   Hlee Fringer C 05/15/2011, 9:34 AM [facility] [phone]

## 2011-05-15 NOTE — Progress Notes (Signed)
Per CDI query:  Patient had postoperative confusion on POD #1 which resolved over the course of the day.

## 2011-05-15 NOTE — Progress Notes (Signed)
Physical Therapy Treatment Patient Details Name: Johnathan Ayala MRN: [id number] DOB: 24-Nov-1960 Today's Date: 05/15/2011  PT Assessment/Plan  PT - Assessment/Plan Comments on Treatment Session: Pt s/p lumbar fusion and able to tolerate increased ambulation distance/independence today.  Would benefit from continued acute PT to maximize independence with ambulation as well as trial stairs.  Not ready for safe d/c home at this time.  Co-treatment with OT. PT Plan: Discharge plan remains appropriate;Frequency remains appropriate PT Frequency: Min 5X/week Follow Up Recommendations: Home health PT Equipment Recommended: None recommended by PT PT Goals  Acute Rehab PT Goals PT Goal Formulation: With patient/family Time For Goal Achievement: 7 days PT Goal: Rolling Supine to Left Side - Progress: Met PT Goal: Supine/Side to Sit - Progress: Progressing toward goal PT Goal: Sit to Stand - Progress: Progressing toward goal PT Goal: Stand to Sit - Progress: Progressing toward goal PT Goal: Ambulate - Progress: Progressing toward goal  PT Treatment Precautions/Restrictions  Precautions Precautions: Back Precaution Booklet Issued: No Precaution Comments: Pt able to recall 2/3 back precautions.  Assist to recall "no arching." Required Braces or Orthoses: Yes Spinal Brace: Thoracolumbosacral orthotic;Other (comment);Applied in sitting position (With right LE extension.) Spinal Brace Comments: Total assist to don brace EOB. Restrictions Weight Bearing Restrictions: No Pain 6/10 in back with pt premedicated prior to treatment. Mobility (including Balance) Bed Mobility Bed Mobility: Yes Rolling Left: 6: Modified independent (Device/Increase time) Left Sidelying to Sit: 4: Min assist;With rails;HOB flat Left Sidelying to Sit Details (indicate cue type and reason): Pt using therapist's arm to pull self to midline from left sidelying.   Sit to Sidelying Right: Not Tested  (comment) Transfers Transfers: Yes Sit to Stand: 4: Min assist;From bed;With upper extremity assist;From chair/3-in-1 (3 trials.) Sit to Stand Details (indicate cue type and reason): Assist for balance with cues for hand placement. Stand to Sit: 4: Min assist;With upper extremity assist;To chair/3-in-1 (3 trials.) Stand to Sit Details: Assist for safety with cues for safest hand placement. Ambulation/Gait Ambulation/Gait: Yes Ambulation/Gait Assistance: 4: Min assist Ambulation/Gait Assistance Details (indicate cue type and reason): Assist for balance and safety inside RW with pt impulsive throughout treatment. Ambulation Distance (Feet): 70 Feet (15 feet x 2 trials; 40 feet.) Assistive device: Rolling walker Gait Pattern: Decreased stride length Stairs: No Wheelchair Mobility Wheelchair Mobility: No  Posture/Postural Control Posture/Postural Control: No significant limitations Balance Balance Assessed: No End of Session PT - End of Session Equipment Utilized During Treatment: Gait belt;Back brace Activity Tolerance: Patient tolerated treatment well Patient left: in chair;with call bell in reach (With sitter.) Nurse Communication: Mobility status for transfers;Mobility status for ambulation General Behavior During Session: [facility] for tasks performed Cognition: Impaired Cognitive Impairment: Decreased safety awareness with pt impulsive.  Much improved attention and following commands this am.  Johnathan Ayala 05/15/2011, 1:12 PM  05/15/2011 Johnathan Ayala, PT, DPT [phone]

## 2011-05-15 NOTE — Progress Notes (Signed)
Occupational Therapy Treatment Patient Details Name: Johnathan Ayala MRN: [id number] DOB: 06/30/1960 Today's Date: 05/15/2011  OT Assessment/Plan OT Assessment/Plan Comments on Treatment Session: Pt. continues to require constant verbal and tactile cues for safety due to decreased cognition, however improved today from yesterday. Will benefit from further education on ADLs and use of AE for LB ADLs. OT Plan: Discharge plan remains appropriate OT Frequency: Min 2X/week Follow Up Recommendations: Home health OT Equipment Recommended: None recommended by OT OT Goals Acute Rehab OT Goals OT Goal Formulation: With patient/family Time For Goal Achievement: 2 weeks ADL Goals Pt Will Perform Grooming: with set-up;Sitting, chair;Supported ADL Goal: Grooming - Progress: Met Pt Will Transfer to Toilet: with mod assist;3-in-1 ADL Goal: Statistician - Progress: Met  OT Treatment Precautions/Restrictions  Precautions Precautions: Back Precaution Booklet Issued: No Precaution Comments: Re-educated pt on 3/3 back precautions as pt unable to recall any this pm. Required Braces or Orthoses: Yes Spinal Brace: Thoracolumbosacral orthotic;Other (comment);Applied in sitting position Spinal Brace Comments: Total assist EOB to doff brace. Restrictions Weight Bearing Restrictions: No   ADL ADL Grooming: Performed;Wash/dry face;Set up;Minimal assistance Grooming Details (indicate cue type and reason): pt closing eyes and required max v/c to attend to task. Pt very distracted by verbalizing to self constantly.  Where Assessed - Grooming: Standing at sink Upper Body Dressing: Performed;Minimal assistance Upper Body Dressing Details (indicate cue type and reason): With donning gown Where Assessed - Upper Body Dressing: Sitting, bed Toilet Transfer: Performed;Moderate assistance Toilet Transfer Details (indicate cue type and reason): pt self distracted by verbalizing out loud to self. Pt required  constant redirection. Pt responding best to name call and direct command "Johnathan Ayala sit in the chair" due to decreased cognition Toilet Transfer Method: Ambulating Toilet Transfer Equipment: Raised toilet seat with arms (or 3-in-1 over toilet) ADL Comments: Pt. able to recall 2/3 back precautions with ADLs and demonstrated ability to perform log rolling with mod cues and minimal assist for elevating shoulders off bed. Pt. continues to demonstrate decreased cognition requiring cues throughout for safety Mobility  Transfers Sit to Stand: 4: Min assist;From bed;With upper extremity assist Sit to Stand Details (indicate cue type and reason): Min verbal cues for hand placement     End of Session OT - End of Session Equipment Utilized During Treatment: Gait belt;Back brace Activity Tolerance: Patient tolerated treatment well;Other (comment) Patient left: in chair;with call bell in reach;with family/visitor present Nurse Communication: Mobility status for transfers;Mobility status for ambulation General Behavior During Session: [facility] for tasks performed Cognition: Impaired Cognitive Impairment: Decreased safety awareness and mild impulsivity  Co-treat with Flora Lipps, OTR/L Pager [phone]  05/15/2011, 12:38 PM

## 2011-05-15 NOTE — Progress Notes (Signed)
Patient's confusion significantly improved.  Pain well controlled.  Has been getting PT  AVSS  NVI  Dressing CDI  Patient alert and oriented  POD #2 after L2-5 A/P fusion with significant agitation   - continue PT today with brace - norco valium - d/c today as long as patient's pain continues to be well controlled

## 2011-05-16 LAB — URINE CULTURE: Culture: NO GROWTH

## 2011-05-16 NOTE — Discharge Summary (Signed)
NAMETRAYLON, SCHIMMING                 ACCOUNT NO.:  [id number]  MEDICAL RECORD NO.:  [id number]  LOCATION:  5011                         FACILITY:  [facility]  PHYSICIAN:  Estill Bamberg, MD      DATE OF BIRTH:  February 16, 1960  DATE OF ADMISSION:  05/13/2011 DATE OF DISCHARGE:  05/16/2011                              DISCHARGE SUMMARY   ADMISSION DIAGNOSES: 1. Spinal stenosis. 2. Lumbar degenerative scoliosis.  DISCHARGE DIAGNOSES: 1. Spinal stenosis. 2. Lumbar degenerative scoliosis, status post L2-L5 fusion.  ADMISSION HISTORY:  Briefly, Mr. Vanvoorhis is a very pleasant 51 year old male who initially presented to me with severe pain in both the right and the left legs.  I did review an MRI and radiographs, which were notable for spinal stenosis and a severe degenerative lumbar scoliosis from L2-L5.  The patient did fail multiple forms of conservative care and therefore he and I did have a discussion regarding going forward with a lateral interbody fusion.  The patient was admitted on May 13, 2011 for the procedure noted above.  HOSPITAL COURSE:  On May 13, 2011, the patient was brought to surgery and underwent an AP fusion.  The patient fully understood the risks and limitations of the procedure.  He did tolerate the procedure well and was transferred to recovery in stable condition.  The patient was initially admitted to the step-down unit, given postoperative confusion. This confusion did resolve over the course of postoperative day #1.  Of note, I did consult a medicine team to ensure there were no other medical issues present and did not feel that there were.  Again, the patient's confusion did rapidly resolve over the course of postoperative day #1.  The patient was then evaluated by me on the mornings of postoperative day #2 and postoperative day #3.  By the morning of postoperative day #3, the patient's pain was clearly very well controlled.  I did observe the patient ambulate  about his room in my presence, and he did appear to be comfortable.  He did of course have an expected level of pain in his back, but it did appear rather comfortable and it did appear pain was adequately controlled with oral pain medications.  They talked to his nurse from the day prior who did express that he did not require any significant intravenous pain medications.  The patient did have some concerns with discharge home; however, I did go on to explain him that I did feel that he was really head of the curve and doing extremely well.  I did feel, however, the home health physical therapy would be needed and after discussing this particular plan with the patient, he was amenable to discharge home on postoperative day #3 with a home health physical therapy program.  Of particular note, the patient was neurovascularly intact throughout his hospital stay.  DISCHARGE INSTRUCTIONS:  The patient will take Norco for pain and Valium for spasms.  He will wear his TLSO brace with his thigh extension whenever he is up and ambulating.  He will remove the dressings on his back on postoperative day #5 and will follow up with me in approximately 2 weeks after his  procedure.  He will contact me with any questions or concerns prior to his first office visit with me.     Estill Bamberg, MD     MD/MEDQ  D:  05/16/2011  T:  05/16/2011  Job:  [id number]

## 2011-05-16 NOTE — Progress Notes (Signed)
   CARE MANAGEMENT NOTE 05/16/2011  Patient:  Johnathan Ayala, Johnathan Ayala   Account Number:  [id number]  Date Initiated:  05/16/2011  Documentation initiated by:  [facility]  Subjective/Objective Assessment:   posterior spinal fusion     Action/Plan:   Anticipated DC Date:  05/16/2011   Anticipated DC Plan:  HOME W HOME HEALTH SERVICES      DC Planning Services  CM consult      [facility] Choice  HOME HEALTH   Choice offered to / List presented to:  C-1 Patient        HH arranged  HH-2 PT      [facility] agency  [facility]   Status of service:  Completed, signed off Medicare Important Message given?   (If response is "NO", the following Medicare IM given date fields will be blank) Date Medicare IM given:   Date Additional Medicare IM given:    Discharge Disposition:  HOME W HOME HEALTH SERVICES  Per UR Regulation:    If discussed at Long Length of Stay Meetings, dates discussed:    Comments:  05/16/2011 0920 Spoke to pt and offered for [facility]. Requested Genevieve Norlander. Contacted [employer]. Faxed orders to [facility] for [facility] PT with start of care 1-2 days. Isidoro Donning RN CCM Case Mgmt phone [phone]

## 2011-05-16 NOTE — Progress Notes (Signed)
Patient appears comfortable. + LBP as expected.  AVSS Dressings CDI NVI  I did observe patient ambulate, appears comfortable.  Walks slow and guarded, but is able to walk about the room  POD #3 after L2-5 A/P fusion, doing well with adequate pain control  Will discharge patient today with home heatlh PT.  Patient did express some anxiety with going home today, but I did reassure him that he really looks great and that his pain is clearly being appropriately controlled with oral pain meds.  He will of course continue to have pain and weakness during his postoperative course, but I do feel very comfortable getting him home with home health PT.  Patient is agreeable to this plan.  He will follow-up with me in 2 weeks.

## 2011-05-16 NOTE — Progress Notes (Signed)
Physical Therapy Treatment Patient Details Name: Johnathan Ayala MRN: [id number] DOB: 1960/07/21 Today's Date: 05/16/2011  PT Assessment/Plan  PT - Assessment/Plan Comments on Treatment Session: Increased gait distance today with min assit. Still needs assist for balance, safety and walker negotiation with gait. Unable to trial stairs due to decreased safety with gait and pt fatiqued. Spouse needs education on brace don/doff as she will be primary caregiver at home. RN to page if spouse has questions about brace once she arrives.                      PT Plan: Discharge plan remains appropriate;Frequency remains appropriate PT Frequency: Min 5X/week Follow Up Recommendations: Home health PT Equipment Recommended: None recommended by PT PT Goals  Acute Rehab PT Goals PT Goal: Rolling Supine to Left Side - Progress: Progressing toward goal PT Goal: Supine/Side to Sit - Progress: Progressing toward goal PT Goal: Sit to Supine/Side - Progress: Progressing toward goal PT Goal: Sit to Stand - Progress: Progressing toward goal PT Goal: Stand to Sit - Progress: Progressing toward goal PT Goal: Ambulate - Progress: Progressing toward goal  PT Treatment Precautions/Restrictions  Precautions Precautions: Back Precaution Booklet Issued: No Precaution Comments: pt needs cues to recall and demo 3/3 back precautions Required Braces or Orthoses: Yes Spinal Brace: Thoracolumbosacral orthotic;Other (comment);Applied in sitting position (with right lower leg extension) Spinal Brace Comments: total assist to don/doff brace at edge of bed Restrictions Weight Bearing Restrictions: No Mobility (including Balance) Bed Mobility Rolling Left: 6: Modified independent (Device/Increase time);With rail Rolling Left Details (indicate cue type and reason): mod cues for safe technique to adhere to back precautions Left Sidelying to Sit: 5: Supervision;HOB flat;With rails Left Sidelying to Sit Details (indicate cue  type and reason): mod cues to for safe technique to adhere to back precautions Sit to Sidelying Left: 5: Supervision;With rail;HOB flat Sit to Sidelying Left Details (indicate cue type and reason): mod cues for safe technique to ahere to back precautions Transfers Sit to Stand: 5: Supervision;From bed;With upper extremity assist Sit to Stand Details (indicate cue type and reason): cues for safe hand placement and ant wt shift to assist with standing Stand to Sit: 5: Supervision;To bed;With upper extremity assist Stand to Sit Details: cues for hand placement with sitting down for safety and to control descent Ambulation/Gait Ambulation/Gait Assistance: 4: Min assist Ambulation/Gait Assistance Details (indicate cue type and reason): assist/cues for posture, walker safety and postion with gait. pt with multiple episodes of lateral balance instability needed min assist to correct (both left and right x 2-3 each). Pt impulsive with gait needing cues for safety. Ambulation Distance (Feet): 80 Feet Assistive device: Rolling walker Gait Pattern: Step-through pattern;Decreased stride length (unsteady gait)  Posture/Postural Control Posture/Postural Control: No significant limitations Exercise    End of Session PT - End of Session Equipment Utilized During Treatment: Gait belt;Back brace Activity Tolerance: Patient tolerated treatment well Patient left: in chair;in bed;with call bell in reach;Other (comment) (with sitter) Nurse Communication: Mobility status for transfers;Mobility status for ambulation General Behavior During Session: [place] for tasks performed Cognition: Impaired (may be baseline?) Cognitive Impairment: Decreased safety awareness, impulsivity and decreased attention to task with out redirection..does follow 1-step commands consistently.  Sallyanne Kuster 05/16/2011, 11:29 AM  Sallyanne Kuster, PTA Office- [phone] Pager- [phone]

## 2011-05-16 NOTE — Progress Notes (Signed)
Contacted [facility] for RW for home for scheduled d/c today. Isidoro Donning RN CCM Case Mgmt phone [phone]

## 2011-05-16 NOTE — Progress Notes (Signed)
Physical Therapy Treatment Patient Details Name: Johnathan Ayala MRN: [id number] DOB: 19-Feb-1960 Today's Date: 05/16/2011  PT Assessment/Plan  PT - Assessment/Plan Comments on Treatment Session: Pt admitted s/p lumbar fusion and wife present for this treatment.  Pt and wife educated on process of donning/doffing brace at EOB as well as uses of brace.  Pt still with impulsive, unsafe behavior/mobility.  Wife reports this as baseline functioning.  Mobility currently at supervision level except for stairs at min assist. which wife is able to provide.  Wife verbalizes safety with mobility as well as back precautions and shows proficiency in brace donning. PT Plan: Discharge plan remains appropriate;Frequency remains appropriate PT Frequency: Min 5X/week Follow Up Recommendations: Home health PT Equipment Recommended: Rolling walker with 5" wheels PT Goals  Acute Rehab PT Goals PT Goal Formulation: With patient/family Time For Goal Achievement: 7 days PT Goal: Rolling Supine to Left Side - Progress: Met PT Goal: Supine/Side to Sit - Progress: Met PT Goal: Sit to Stand - Progress: Progressing toward goal PT Goal: Stand to Sit - Progress: Progressing toward goal PT Goal: Ambulate - Progress: Progressing toward goal PT Goal: Up/Down Stairs - Progress: Progressing toward goal  PT Treatment Precautions/Restrictions  Precautions Precautions: Back Precaution Booklet Issued: No Precaution Comments: Pt able to recall 2/3 back precautions and is able to recall "no arching" with slight verbal cues.  Wife recalls 3/3 back precautions as well. Required Braces or Orthoses: Yes Spinal Brace: Thoracolumbosacral orthotic;Other (comment);Applied in sitting position (With right leg extension) Spinal Brace Comments: Educated pt's wife in donning brace at EOB.  Pt's wife able to verbalize understanding and demonstrate application. Restrictions Weight Bearing Restrictions: No Pain 0/10 with treatment. Mobility  (including Balance) Bed Mobility Bed Mobility: Yes Rolling Left: 6: Modified independent (Device/Increase time) Left Sidelying to Sit: 6: Modified independent (Device/Increase time) Sit to Sidelying Right: Not Tested (comment) Sit to Sidelying Left: Not Tested (comment) Transfers Transfers: Yes Sit to Stand: 5: Supervision;With upper extremity assist;From bed;From chair/3-in-1 (4 trials.) Sit to Stand Details (indicate cue type and reason): Verbal cues for safest hand placement.  Wife able to verbalize safe hand placement. Stand to Sit: 5: Supervision;With upper extremity assist;To chair/3-in-1;To bed (4 trials.) Stand to Sit Details: Verbal cues for hand placement.  Wife able to verbalize safe hand placement. Ambulation/Gait Ambulation/Gait: Yes Ambulation/Gait Assistance: 5: Supervision Ambulation/Gait Assistance Details (indicate cue type and reason): Verbal cues for safety inside RW by staying close to front, tall posture, and avoidance of unsafe activities such as leaning against various surfaces.  Wife able to verbalize safe technique as well. Ambulation Distance (Feet): 240 Feet (120 feet x 2 trials.) Assistive device: Rolling walker Gait Pattern: Step-through pattern;Decreased stride length Stairs: Yes Stairs Assistance: 4: Min assist (Up to min assist.) Stairs Assistance Details (indicate cue type and reason): Pt performed 2 trials of stairs.  1st trial forward with rail on right and min (guard) for balance.  Cues for safe technique using "up with good, down with bad."  2nd trial forward without rail and use of HHA on left in place of rail.  Pt requiring min assist for balance with cues for safe sequence using "up with good, down with bad."  Wife able to verbalize safe sequence and provide min assist via HHA as needed. Stair Management Technique: Step to pattern;Forwards (1st trial one rail right, 2nd trial HHA left.) Number of Stairs: 8  (1st trial 4, 2nd trial 4.) Height of  Stairs: 8  (inches.) Naval architect  Mobility: No  Posture/Postural Control Posture/Postural Control: No significant limitations Balance Balance Assessed: No End of Session PT - End of Session Equipment Utilized During Treatment: Gait belt;Back brace Activity Tolerance: Patient tolerated treatment well Patient left: in bed;with call bell in reach;with family/visitor present (At EOB; Sitter present.) Nurse Communication: Mobility status for transfers;Mobility status for ambulation General Behavior During Session: [facility] for tasks performed Cognition: Impaired, at baseline Cognitive Impairment: Decreased safety awareness, impulsivity and decreased attention to task with out redirection. Follows 1-step commands consistently.  Wife able to report this is baseline functioning.  [name] M 05/16/2011, 5:31 PM  05/16/2011 [name], PT, DPT [phone]

## 2011-05-17 MED FILL — Sodium Chloride Irrigation Soln 0.9%: Qty: 3000 | Status: AC

## 2011-05-17 MED FILL — Heparin Sodium (Porcine) Inj 1000 Unit/ML: INTRAMUSCULAR | Qty: 30 | Status: AC

## 2011-05-17 MED FILL — Sodium Chloride IV Soln 0.9%: INTRAVENOUS | Qty: 1000 | Status: AC

## 2012-03-08 ENCOUNTER — Other Ambulatory Visit: Payer: Self-pay | Admitting: Orthopaedic Surgery

## 2012-03-08 DIAGNOSIS — R52 Pain, unspecified: Secondary | ICD-10-CM

## 2012-03-08 DIAGNOSIS — R609 Edema, unspecified: Secondary | ICD-10-CM

## 2012-03-10 ENCOUNTER — Ambulatory Visit
Admission: RE | Admit: 2012-03-10 | Discharge: 2012-03-10 | Disposition: A | Discharge status: Home / Self Care | Payer: Medicare Other | Source: Ambulatory Visit | Attending: Orthopaedic Surgery | Admitting: Orthopaedic Surgery

## 2012-03-10 DIAGNOSIS — R609 Edema, unspecified: Secondary | ICD-10-CM

## 2012-03-10 DIAGNOSIS — R52 Pain, unspecified: Secondary | ICD-10-CM

## 2012-04-11 ENCOUNTER — Other Ambulatory Visit: Payer: Self-pay | Admitting: Orthopedic Surgery

## 2012-04-11 DIAGNOSIS — M549 Dorsalgia, unspecified: Secondary | ICD-10-CM

## 2012-05-02 ENCOUNTER — Other Ambulatory Visit (HOSPITAL_COMMUNITY): Payer: Self-pay | Admitting: Neurology

## 2012-11-02 ENCOUNTER — Other Ambulatory Visit: Payer: Self-pay | Admitting: Orthopaedic Surgery

## 2012-11-07 NOTE — Pre-Procedure Instructions (Addendum)
NUCHEM GRATTAN  11/07/2012   Your procedure is scheduled on:  Oct 14 @0730   Report to [facility] Short Stay (use Main Entrance "A'') 5:30 AM.  Call this number if you have problems the morning of surgery: [phone]   Remember:   Do not eat food or drink liquids after midnight.   Take these medicines the morning of surgery with A SIP OF WATER: Atenolol (Tenormin), Fluoxetin (Prozac),  Gabapentin (Neurontin), Lamotrigine (Lamictal), Ranitidine (Zantac), and Hydroxyzine (Atarax/Vistaril)   Stop taking Aspirin, Aleve, Ibuprofen, Herbal Medications, Fish Oil, BC's, Goody's Stop taking Glucosamine 5 days before your surgery.    Do not wear jewelry, make-up or nail polish.  Do not wear lotions, powders, or perfumes. You may wear deodorant.  Do not shave 48 hours prior to surgery. Men may shave face and neck.  Do not bring valuables to the hospital.  [facility] is not responsible                  for any belongings or valuables.               Contacts, dentures or bridgework may not be worn into surgery.  Leave suitcase in the car. After surgery it may be brought to your room.  For patients admitted to the hospital, discharge time is determined by your                treatment team.               Patients discharged the day of surgery will not be allowed to drive  home.    Special Instructions: Shower using CHG 2 nights before surgery and the night before surgery.  If you shower the day of surgery use CHG.  Use special wash - you have one bottle of CHG for all showers.  You should use approximately 1/3 of the bottle for each shower.   Please read over the following fact sheets that you were given: Pain Booklet, Coughing and Deep Breathing, Blood Transfusion Information, MRSA Information and Surgical Site Infection Prevention

## 2012-11-08 ENCOUNTER — Encounter (HOSPITAL_COMMUNITY)
Admission: RE | Admit: 2012-11-08 | Discharge: 2012-11-08 | Disposition: A | Discharge status: Home / Self Care | Payer: Medicare Other | Source: Ambulatory Visit | Attending: Orthopaedic Surgery | Admitting: Orthopaedic Surgery

## 2012-11-08 ENCOUNTER — Encounter (HOSPITAL_COMMUNITY): Payer: Self-pay | Admitting: Respiratory Therapy

## 2012-11-08 ENCOUNTER — Encounter (HOSPITAL_COMMUNITY): Payer: Self-pay

## 2012-11-08 DIAGNOSIS — Z01818 Encounter for other preprocedural examination: Secondary | ICD-10-CM | POA: Insufficient documentation

## 2012-11-08 DIAGNOSIS — Z01812 Encounter for preprocedural laboratory examination: Secondary | ICD-10-CM | POA: Insufficient documentation

## 2012-11-08 HISTORY — DX: Anxiety disorder, unspecified: F41.9

## 2012-11-08 HISTORY — DX: Gastro-esophageal reflux disease without esophagitis: K21.9

## 2012-11-08 LAB — CBC WITH DIFFERENTIAL/PLATELET
Eosinophils Absolute: 0.1 10*3/uL (ref 0.0–0.7)
Eosinophils Relative: 1 % (ref 0–5)
Hemoglobin: 15.4 g/dL (ref 13.0–17.0)
Lymphocytes Relative: 28 % (ref 12–46)
Lymphs Abs: 3.6 10*3/uL (ref 0.7–4.0)
MCH: 30.6 pg (ref 26.0–34.0)
MCHC: 36.2 g/dL — ABNORMAL HIGH (ref 30.0–36.0)
MCV: 84.7 fL (ref 78.0–100.0)
Monocytes Absolute: 1.3 10*3/uL — ABNORMAL HIGH (ref 0.1–1.0)
Monocytes Relative: 10 % (ref 3–12)
Neutrophils Relative %: 62 % (ref 43–77)
RBC: 5.03 MIL/uL (ref 4.22–5.81)
WBC: 13.1 10*3/uL — ABNORMAL HIGH (ref 4.0–10.5)

## 2012-11-08 LAB — URINALYSIS, ROUTINE W REFLEX MICROSCOPIC
Bilirubin Urine: NEGATIVE
Ketones, ur: NEGATIVE mg/dL
Leukocytes, UA: NEGATIVE
Nitrite: NEGATIVE
Specific Gravity, Urine: 1.021 (ref 1.005–1.030)
pH: 6.5 (ref 5.0–8.0)

## 2012-11-08 LAB — BASIC METABOLIC PANEL
BUN: 11 mg/dL (ref 6–23)
CO2: 28 mEq/L (ref 19–32)
Glucose, Bld: 88 mg/dL (ref 70–99)
Potassium: 3.9 mEq/L (ref 3.5–5.1)
Sodium: 138 mEq/L (ref 135–145)

## 2012-11-08 LAB — SURGICAL PCR SCREEN
MRSA, PCR: NEGATIVE
Staphylococcus aureus: NEGATIVE

## 2012-11-08 LAB — PROTIME-INR: INR: 1.12 (ref 0.00–1.49)

## 2012-11-08 LAB — TYPE AND SCREEN

## 2012-11-08 NOTE — Progress Notes (Signed)
SPOKE WITH KATHY AT DR. DALLDORFS OFFICE WHO STATED DR. DALLDORF DOES NOT WANT PATIENT TO STOP ASPIRIN, HE SHOULD CONTINUE IT. ALSO MADE KATHY AWARE PATIENT WAS BEING TX. FOR SINUS INFECTION,   REQUESTING CARDIAC RECORDS + SLEEP STUDY.

## 2012-11-09 NOTE — Progress Notes (Addendum)
Anesthesia Chart Review:  Patient is a 52 year old male scheduled for right TKA on 11/21/12 by Dr. Jerl Santos.  History includes smoking, CAD s/p DES LAD & DIAG '03, PNA 2012, OSA, HTN, GERD, depression, anxiety. According to [facility] records he also has a history of Bipolar disorder, borderline and narcissistic personality traits, and prior suicidal ideation.  He is s/p L2-5 ALIF on 05/13/11. His Cardiologist is Dr. Christella Noa at [facility]. PCP is Dr. Lindwood Qua.   Nuclear stress test on 03/31/11 showed normal myocardial perfusion, Global systolic function was normal , EF 63%, RV chamber size was borderline dilated, coronary calcification involving the RCA, with a stent in the LAD territory were seen, a 8mm RUL pulmonary nodule without definitive uptake was seen abutting the mediastinum.  According to notes, the finding of a pulmonary nodule on his stress test were discussed with him and smoking cessation and follow-up with his PCP were recommended. (Report is scanned under Media tab, Correspondence 05/18/11.)  Echo done at [facility] on 07/29/10 showed:  - Left ventricle: The cavity size was normal. Wall thickness was increased in a pattern of mild LVH. The estimated ejection fraction was 60%. Wall motion was normal; there were no regional wall motion abnormalities. - Pericardium, extracardiac: There is an insignificant trivial pericardial effusion along the right atrial wall.  According to Cardiology records, his last cath was in 2004 and showed insignificant atherosclerotic CAD with patent stents, preserved LVF.   EKG on 01/12/12 showed NSR.  CXR on 11/08/12 showed no active cardiopulmonary disease.  Preoperative labs noted.  I'll follow-up one additional cardiology records are received.  Velna Ochs [facility] Short Stay Center/Anesthesiology Phone [phone] 11/09/2012 4:21 PM  Addendum: 11/13/2012 11:45 AM Received cardiology records from 01/12/12. He has been seen within the past year  and had a non-ischemic stress test within the past two years.  No CV symptoms were documented from his PAT visit.  If no acute changes or new CV symptomology then I would anticipate that he could proceed as planned.

## 2012-11-15 NOTE — H&P (Signed)
TOTAL KNEE ADMISSION H&P  Patient is being admitted for right total knee arthroplasty.  Subjective:  Chief Complaint:right knee pain.  HPI: Johnathan Ayala, 52 y.o. male, has a history of pain and functional disability in the right knee due to arthritis and has failed non-surgical conservative treatments for greater than 12 weeks to includeNSAID's and/or analgesics, corticosteriod injections, viscosupplementation injections, supervised PT with diminished ADL's post treatment and activity modification.  Onset of symptoms was gradual, starting 5 years ago with gradually worsening course since that time. The patient noted prior procedures on the knee to include  arthroscopy on the right knee(s).  Patient currently rates pain in the right knee(s) at 9 out of 10 with activity. Patient has night pain, worsening of pain with activity and weight bearing, pain that interferes with activities of daily living, pain with passive range of motion and crepitus.  Patient has evidence of subchondral sclerosis, periarticular osteophytes and joint space narrowing by imaging studies. This patient has had scope. There is no active infection.  There are no active problems to display for this patient.  Past Medical History  Diagnosis Date  . Hypertension   . Mental disorder   . Coronary artery disease     [facility] (Dr. Christella Noa)  . Pneumonia 2012  . Depression   . Anxiety   . Sleep apnea     study done 7-9 years ago . uses  c-pap  . GERD (gastroesophageal reflux disease)     heartburn   . Neuromuscular disorder     lt arm     2014      Past Surgical History  Procedure Laterality Date  . Appendectomy    . Back surgery  2010  . Tonsillectomy    . Knee arthroscopy  2 yrs ago.     right   . Coronary angioplasty      DES LAD, DIAG '03  . Anterior lat lumbar fusion  05/13/2011    Procedure: ANTERIOR LATERAL LUMBAR FUSION 3 LEVELS;  Surgeon: Emilee Hero, MD;  Location: [facility] OR;  Service: Orthopedics;   Laterality: Right;  Extreme lateral interbody fusion lumbar 2-3, lumbar 3-4, lumbar 4-5 with allograft  . Knee arthroscopy      3 total    rt knee   . Ulnar nerve repair      left arm    2 months     No prescriptions prior to admission   No Known Allergies  History  Substance Use Topics  . Smoking status: Current Every Day Smoker -- 1.50 packs/day for 30 years    Types: Cigarettes  . Smokeless tobacco: Not on file  . Alcohol Use: No    Family History  Problem Relation Age of Onset  . Anesthesia problems Neg Hx   . Hypotension Neg Hx   . Malignant hyperthermia Neg Hx   . Pseudochol deficiency Neg Hx      Review of Systems  Musculoskeletal: Positive for joint pain.  Psychiatric/Behavioral: Positive for depression.  All other systems reviewed and are negative.    Objective:  Physical Exam  Constitutional: He appears well-developed.  HENT:  Head: Normocephalic.  Eyes: Pupils are equal, round, and reactive to light.  Neck: Normal range of motion.  Cardiovascular: Normal heart sounds.   Respiratory: Effort normal.  GI: Soft.  Musculoskeletal:  Right knee exam: Range of motion 0-1 20.  Significant patellofemoral pain and crepitation.  No significant effusion.  Neurological: He is alert.  Skin: Skin is  warm.  Psychiatric: He has a normal mood and affect.    Vital signs in last 24 hours:    Labs:   Estimated body mass index is 34.17 kg/(m^2) as calculated from the following:   Height as of 05/13/11: 6' (1.829 m).   Weight as of 05/04/11: 114.306 kg (252 lb).   Imaging Review Plain radiographs demonstrate severe degenerative joint disease of the right knee(s). The overall alignment isneutral. The bone quality appears to be good for age and reported activity level.  Assessment/Plan:  End stage arthritis, right knee   The patient history, physical examination, clinical judgment of the provider and imaging studies are consistent with end stage degenerative joint  disease of the right knee(s) and total knee arthroplasty is deemed medically necessary. The treatment options including medical management, injection therapy arthroscopy and arthroplasty were discussed at length. The risks and benefits of total knee arthroplasty were presented and reviewed. The risks due to aseptic loosening, infection, stiffness, patella tracking problems, thromboembolic complications and other imponderables were discussed. The patient acknowledged the explanation, agreed to proceed with the plan and consent was signed. Patient is being admitted for inpatient treatment for surgery, pain control, PT, OT, prophylactic antibiotics, VTE prophylaxis, progressive ambulation and ADL's and discharge planning. The patient is planning to be discharged home with home health services

## 2012-11-20 MED ORDER — CEFAZOLIN SODIUM-DEXTROSE 2-3 GM-% IV SOLR
2.0000 g | INTRAVENOUS | Status: AC
  Administered 2012-11-21: 2 g via INTRAVENOUS
  Filled 2012-11-20: qty 50

## 2012-11-20 NOTE — Progress Notes (Signed)
Pt notified of time change;to arrive at 0830 

## 2012-11-21 ENCOUNTER — Encounter (HOSPITAL_COMMUNITY): Payer: Medicare Other | Admitting: Vascular Surgery

## 2012-11-21 ENCOUNTER — Inpatient Hospital Stay (HOSPITAL_COMMUNITY): Payer: Medicare Other | Admitting: Anesthesiology

## 2012-11-21 ENCOUNTER — Encounter (HOSPITAL_COMMUNITY): Payer: Self-pay | Admitting: *Deleted

## 2012-11-21 ENCOUNTER — Encounter (HOSPITAL_COMMUNITY)
Admission: RE | Disposition: A | Discharge status: Home Health | Payer: Self-pay | Source: Ambulatory Visit | Attending: Orthopaedic Surgery

## 2012-11-21 ENCOUNTER — Inpatient Hospital Stay (HOSPITAL_COMMUNITY)
Admission: RE | Admit: 2012-11-21 | Discharge: 2012-11-23 | DRG: 470 | Disposition: A | Discharge status: Home Health | Payer: Medicare Other | Source: Ambulatory Visit | Attending: Orthopaedic Surgery | Admitting: Orthopaedic Surgery

## 2012-11-21 DIAGNOSIS — I2510 Atherosclerotic heart disease of native coronary artery without angina pectoris: Secondary | ICD-10-CM | POA: Diagnosis present

## 2012-11-21 DIAGNOSIS — M1711 Unilateral primary osteoarthritis, right knee: Secondary | ICD-10-CM

## 2012-11-21 DIAGNOSIS — F329 Major depressive disorder, single episode, unspecified: Secondary | ICD-10-CM | POA: Diagnosis present

## 2012-11-21 DIAGNOSIS — G4730 Sleep apnea, unspecified: Secondary | ICD-10-CM | POA: Diagnosis present

## 2012-11-21 DIAGNOSIS — F17200 Nicotine dependence, unspecified, uncomplicated: Secondary | ICD-10-CM | POA: Diagnosis present

## 2012-11-21 DIAGNOSIS — F411 Generalized anxiety disorder: Secondary | ICD-10-CM | POA: Diagnosis present

## 2012-11-21 DIAGNOSIS — I10 Essential (primary) hypertension: Secondary | ICD-10-CM | POA: Diagnosis present

## 2012-11-21 DIAGNOSIS — K219 Gastro-esophageal reflux disease without esophagitis: Secondary | ICD-10-CM | POA: Diagnosis present

## 2012-11-21 DIAGNOSIS — F3289 Other specified depressive episodes: Secondary | ICD-10-CM | POA: Diagnosis present

## 2012-11-21 DIAGNOSIS — Z9861 Coronary angioplasty status: Secondary | ICD-10-CM

## 2012-11-21 DIAGNOSIS — M1710 Unilateral primary osteoarthritis, unspecified knee: Principal | ICD-10-CM | POA: Diagnosis present

## 2012-11-21 HISTORY — PX: TOTAL KNEE ARTHROPLASTY: SHX125

## 2012-11-21 SURGERY — ARTHROPLASTY, KNEE, TOTAL
Anesthesia: General | Site: Knee | Laterality: Right | Wound class: Clean

## 2012-11-21 MED ORDER — FLUOXETINE HCL 20 MG PO CAPS
60.0000 mg | ORAL_CAPSULE | Freq: Every day | ORAL | Status: DC
  Administered 2012-11-23: 60 mg via ORAL
  Filled 2012-11-21 (×2): qty 3

## 2012-11-21 MED ORDER — 0.9 % SODIUM CHLORIDE (POUR BTL) OPTIME
TOPICAL | Status: DC | PRN
  Administered 2012-11-21: 1000 mL

## 2012-11-21 MED ORDER — HYDROMORPHONE HCL PF 1 MG/ML IJ SOLN
0.5000 mg | INTRAMUSCULAR | Status: DC | PRN
  Administered 2012-11-21 – 2012-11-22 (×2): 1 mg via INTRAVENOUS
  Filled 2012-11-21 (×3): qty 1

## 2012-11-21 MED ORDER — FLEET ENEMA 7-19 GM/118ML RE ENEM: 1.0000 | ENEMA | Freq: Once | RECTAL | Status: AC | PRN

## 2012-11-21 MED ORDER — ACETAMINOPHEN 650 MG RE SUPP: 650.0000 mg | Freq: Four times a day (QID) | RECTAL | Status: DC | PRN

## 2012-11-21 MED ORDER — CEFAZOLIN SODIUM-DEXTROSE 2-3 GM-% IV SOLR
2.0000 g | Freq: Four times a day (QID) | INTRAVENOUS | Status: AC
  Administered 2012-11-21 (×2): 2 g via INTRAVENOUS
  Filled 2012-11-21 (×2): qty 50

## 2012-11-21 MED ORDER — LACTATED RINGERS IV SOLN
INTRAVENOUS | Status: DC
  Administered 2012-11-21 (×2): via INTRAVENOUS

## 2012-11-21 MED ORDER — CHLORHEXIDINE GLUCONATE 4 % EX LIQD: 60.0000 mL | Freq: Once | CUTANEOUS | Status: DC

## 2012-11-21 MED ORDER — SODIUM CHLORIDE 0.9 % IR SOLN
Status: DC | PRN
  Administered 2012-11-21: 3000 mL

## 2012-11-21 MED ORDER — HYDROMORPHONE HCL PF 1 MG/ML IJ SOLN
0.2500 mg | INTRAMUSCULAR | Status: DC | PRN
  Administered 2012-11-21 (×4): 0.5 mg via INTRAVENOUS

## 2012-11-21 MED ORDER — MENTHOL 3 MG MT LOZG: 1.0000 | LOZENGE | OROMUCOSAL | Status: DC | PRN

## 2012-11-21 MED ORDER — DEXTROSE 5 % IV SOLN
500.0000 mg | Freq: Four times a day (QID) | INTRAVENOUS | Status: DC | PRN
  Filled 2012-11-21: qty 5

## 2012-11-21 MED ORDER — PNEUMOCOCCAL VAC POLYVALENT 25 MCG/0.5ML IJ INJ
0.5000 mL | INJECTION | INTRAMUSCULAR | Status: AC
  Administered 2012-11-22: 0.5 mL via INTRAMUSCULAR
  Filled 2012-11-21: qty 0.5

## 2012-11-21 MED ORDER — DOCUSATE SODIUM 100 MG PO CAPS
100.0000 mg | ORAL_CAPSULE | Freq: Two times a day (BID) | ORAL | Status: DC
  Administered 2012-11-21 – 2012-11-23 (×5): 100 mg via ORAL
  Filled 2012-11-21 (×5): qty 1

## 2012-11-21 MED ORDER — OXYCODONE HCL 5 MG PO TABS
5.0000 mg | ORAL_TABLET | Freq: Once | ORAL | Status: AC | PRN
  Administered 2012-11-21: 5 mg via ORAL

## 2012-11-21 MED ORDER — GABAPENTIN 600 MG PO TABS
600.0000 mg | ORAL_TABLET | Freq: Every day | ORAL | Status: DC
  Administered 2012-11-21 – 2012-11-22 (×2): 600 mg via ORAL
  Filled 2012-11-21 (×4): qty 1

## 2012-11-21 MED ORDER — GABAPENTIN 300 MG PO CAPS
300.0000 mg | ORAL_CAPSULE | Freq: Every morning | ORAL | Status: DC
  Administered 2012-11-22: 300 mg via ORAL
  Filled 2012-11-21 (×3): qty 1

## 2012-11-21 MED ORDER — HYDROMORPHONE HCL PF 1 MG/ML IJ SOLN
INTRAMUSCULAR | Status: AC
  Filled 2012-11-21: qty 1

## 2012-11-21 MED ORDER — LACTATED RINGERS IV SOLN
INTRAVENOUS | Status: DC
  Administered 2012-11-21 – 2012-11-22 (×3): via INTRAVENOUS

## 2012-11-21 MED ORDER — METOCLOPRAMIDE HCL 10 MG PO TABS: 5.0000 mg | ORAL_TABLET | Freq: Three times a day (TID) | ORAL | Status: DC | PRN

## 2012-11-21 MED ORDER — LAMOTRIGINE 200 MG PO TABS
200.0000 mg | ORAL_TABLET | Freq: Two times a day (BID) | ORAL | Status: DC
  Administered 2012-11-21 – 2012-11-23 (×4): 200 mg via ORAL
  Filled 2012-11-21 (×5): qty 1

## 2012-11-21 MED ORDER — MIDAZOLAM HCL 2 MG/2ML IJ SOLN: 2.0000 mg | Freq: Once | INTRAMUSCULAR | Status: DC

## 2012-11-21 MED ORDER — ATENOLOL 50 MG PO TABS
50.0000 mg | ORAL_TABLET | Freq: Every day | ORAL | Status: DC
  Administered 2012-11-22 – 2012-11-23 (×2): 50 mg via ORAL
  Filled 2012-11-21 (×3): qty 1

## 2012-11-21 MED ORDER — LIDOCAINE HCL (CARDIAC) 20 MG/ML IV SOLN
INTRAVENOUS | Status: DC | PRN
  Administered 2012-11-21: 40 mg via INTRAVENOUS

## 2012-11-21 MED ORDER — METOCLOPRAMIDE HCL 5 MG/ML IJ SOLN: 5.0000 mg | Freq: Three times a day (TID) | INTRAMUSCULAR | Status: DC | PRN

## 2012-11-21 MED ORDER — DIPHENHYDRAMINE HCL 12.5 MG/5ML PO ELIX: 12.5000 mg | ORAL_SOLUTION | ORAL | Status: DC | PRN

## 2012-11-21 MED ORDER — ASPIRIN EC 325 MG PO TBEC
325.0000 mg | DELAYED_RELEASE_TABLET | Freq: Two times a day (BID) | ORAL | Status: DC
  Administered 2012-11-22 – 2012-11-23 (×3): 325 mg via ORAL
  Filled 2012-11-21 (×5): qty 1

## 2012-11-21 MED ORDER — FENTANYL CITRATE 0.05 MG/ML IJ SOLN
INTRAMUSCULAR | Status: DC | PRN
  Administered 2012-11-21 (×5): 50 ug via INTRAVENOUS

## 2012-11-21 MED ORDER — FENTANYL CITRATE 0.05 MG/ML IJ SOLN
INTRAMUSCULAR | Status: AC
  Filled 2012-11-21: qty 2

## 2012-11-21 MED ORDER — INFLUENZA VAC SPLIT QUAD 0.5 ML IM SUSP
0.5000 mL | INTRAMUSCULAR | Status: AC
  Administered 2012-11-22: 0.5 mL via INTRAMUSCULAR
  Filled 2012-11-21: qty 0.5

## 2012-11-21 MED ORDER — OXYCODONE HCL 5 MG PO TABS
5.0000 mg | ORAL_TABLET | ORAL | Status: DC | PRN
  Administered 2012-11-21: 10 mg via ORAL
  Administered 2012-11-21 – 2012-11-23 (×4): 15 mg via ORAL
  Filled 2012-11-21 (×3): qty 3
  Filled 2012-11-21: qty 2
  Filled 2012-11-21: qty 3

## 2012-11-21 MED ORDER — ONDANSETRON HCL 4 MG/2ML IJ SOLN: 4.0000 mg | Freq: Once | INTRAMUSCULAR | Status: DC | PRN

## 2012-11-21 MED ORDER — MIDAZOLAM HCL 2 MG/2ML IJ SOLN: 0.5000 mg | Freq: Once | INTRAMUSCULAR | Status: DC | PRN

## 2012-11-21 MED ORDER — ALUM & MAG HYDROXIDE-SIMETH 200-200-20 MG/5ML PO SUSP: 30.0000 mL | ORAL | Status: DC | PRN

## 2012-11-21 MED ORDER — FERROUS SULFATE 325 (65 FE) MG PO TABS
325.0000 mg | ORAL_TABLET | Freq: Every day | ORAL | Status: DC
  Administered 2012-11-22 – 2012-11-23 (×2): 325 mg via ORAL
  Filled 2012-11-21 (×3): qty 1

## 2012-11-21 MED ORDER — HYDROXYZINE HCL 50 MG PO TABS
50.0000 mg | ORAL_TABLET | Freq: Two times a day (BID) | ORAL | Status: DC
  Administered 2012-11-21 – 2012-11-23 (×3): 50 mg via ORAL
  Filled 2012-11-21 (×5): qty 1

## 2012-11-21 MED ORDER — PROPOFOL 10 MG/ML IV BOLUS
INTRAVENOUS | Status: DC | PRN
  Administered 2012-11-21: 200 mg via INTRAVENOUS

## 2012-11-21 MED ORDER — BISACODYL 10 MG RE SUPP: 10.0000 mg | Freq: Every day | RECTAL | Status: DC | PRN

## 2012-11-21 MED ORDER — OXYCODONE HCL 5 MG/5ML PO SOLN: 5.0000 mg | Freq: Once | ORAL | Status: AC | PRN

## 2012-11-21 MED ORDER — CLOZAPINE 100 MG PO TABS
400.0000 mg | ORAL_TABLET | Freq: Every day | ORAL | Status: DC
  Administered 2012-11-21 – 2012-11-22 (×2): 400 mg via ORAL
  Filled 2012-11-21 (×3): qty 4

## 2012-11-21 MED ORDER — ACETAMINOPHEN 325 MG PO TABS: 650.0000 mg | ORAL_TABLET | Freq: Four times a day (QID) | ORAL | Status: DC | PRN

## 2012-11-21 MED ORDER — GABAPENTIN 300 MG PO CAPS: 300.0000 mg | ORAL_CAPSULE | Freq: Two times a day (BID) | ORAL | Status: DC

## 2012-11-21 MED ORDER — FENTANYL CITRATE 0.05 MG/ML IJ SOLN
100.0000 ug | Freq: Once | INTRAMUSCULAR | Status: AC
  Administered 2012-11-21: 100 ug via INTRAVENOUS

## 2012-11-21 MED ORDER — ONDANSETRON HCL 4 MG/2ML IJ SOLN: 4.0000 mg | Freq: Four times a day (QID) | INTRAMUSCULAR | Status: DC | PRN

## 2012-11-21 MED ORDER — PHENYLEPHRINE HCL 10 MG/ML IJ SOLN
INTRAMUSCULAR | Status: DC | PRN
  Administered 2012-11-21 (×3): 80 ug via INTRAVENOUS

## 2012-11-21 MED ORDER — FAMOTIDINE 20 MG PO TABS
20.0000 mg | ORAL_TABLET | Freq: Every day | ORAL | Status: DC
  Administered 2012-11-22 – 2012-11-23 (×2): 20 mg via ORAL
  Filled 2012-11-21 (×3): qty 1

## 2012-11-21 MED ORDER — PHENOL 1.4 % MT LIQD: 1.0000 | OROMUCOSAL | Status: DC | PRN

## 2012-11-21 MED ORDER — MIDAZOLAM HCL 2 MG/2ML IJ SOLN
INTRAMUSCULAR | Status: AC
  Administered 2012-11-21: 2 mg
  Filled 2012-11-21: qty 2

## 2012-11-21 MED ORDER — OXYCODONE HCL 5 MG PO TABS
ORAL_TABLET | ORAL | Status: AC
  Filled 2012-11-21: qty 1

## 2012-11-21 MED ORDER — ONDANSETRON HCL 4 MG/2ML IJ SOLN
INTRAMUSCULAR | Status: DC | PRN
  Administered 2012-11-21: 4 mg via INTRAMUSCULAR

## 2012-11-21 MED ORDER — ONDANSETRON HCL 4 MG PO TABS: 4.0000 mg | ORAL_TABLET | Freq: Four times a day (QID) | ORAL | Status: DC | PRN

## 2012-11-21 MED ORDER — METHOCARBAMOL 500 MG PO TABS
500.0000 mg | ORAL_TABLET | Freq: Four times a day (QID) | ORAL | Status: DC | PRN
  Administered 2012-11-21 – 2012-11-23 (×5): 500 mg via ORAL
  Filled 2012-11-21 (×5): qty 1

## 2012-11-21 MED ORDER — METHOCARBAMOL 500 MG PO TABS
ORAL_TABLET | ORAL | Status: AC
  Filled 2012-11-21: qty 1

## 2012-11-21 SURGICAL SUPPLY — 64 items
BANDAGE ELASTIC 4 VELCRO ST LF (GAUZE/BANDAGES/DRESSINGS) ×2 IMPLANT
BANDAGE ELASTIC 6 VELCRO ST LF (GAUZE/BANDAGES/DRESSINGS) ×2 IMPLANT
BANDAGE ESMARK 6X9 LF (GAUZE/BANDAGES/DRESSINGS) ×1 IMPLANT
BANDAGE GAUZE ELAST BULKY 4 IN (GAUZE/BANDAGES/DRESSINGS) ×2 IMPLANT
BLADE SAGITTAL 25.0X1.19X90 (BLADE) ×2 IMPLANT
BLADE SURG ROTATE 9660 (MISCELLANEOUS) IMPLANT
BNDG ELASTIC 6X10 VLCR STRL LF (GAUZE/BANDAGES/DRESSINGS) ×2 IMPLANT
BNDG ESMARK 6X9 LF (GAUZE/BANDAGES/DRESSINGS) ×2
BOWL SMART MIX CTS (DISPOSABLE) ×2 IMPLANT
CAP UPCHARGE REVISION TRAY ×2 IMPLANT
CAPT RP KNEE ×2 IMPLANT
CEMENT HV SMART SET (Cement) ×4 IMPLANT
CLOTH BEACON ORANGE TIMEOUT ST (SAFETY) ×2 IMPLANT
COVER SURGICAL LIGHT HANDLE (MISCELLANEOUS) ×2 IMPLANT
CUFF TOURNIQUET SINGLE 34IN LL (TOURNIQUET CUFF) ×2 IMPLANT
CUFF TOURNIQUET SINGLE 44IN (TOURNIQUET CUFF) IMPLANT
DRAPE EXTREMITY T 121X128X90 (DRAPE) ×2 IMPLANT
DRAPE PROXIMA HALF (DRAPES) ×2 IMPLANT
DRAPE U-SHAPE 47X51 STRL (DRAPES) ×2 IMPLANT
DRSG ADAPTIC 3X8 NADH LF (GAUZE/BANDAGES/DRESSINGS) ×2 IMPLANT
DRSG PAD ABDOMINAL 8X10 ST (GAUZE/BANDAGES/DRESSINGS) ×2 IMPLANT
DURAPREP 26ML APPLICATOR (WOUND CARE) ×2 IMPLANT
ELECT REM PT RETURN 9FT ADLT (ELECTROSURGICAL) ×2
ELECTRODE REM PT RTRN 9FT ADLT (ELECTROSURGICAL) ×1 IMPLANT
FACESHIELD LNG OPTICON STERILE (SAFETY) ×4 IMPLANT
GLOVE BIO SURGEON STRL SZ8.5 (GLOVE) ×2 IMPLANT
GLOVE BIOGEL PI IND STRL 8 (GLOVE) ×1 IMPLANT
GLOVE BIOGEL PI IND STRL 8.5 (GLOVE) ×1 IMPLANT
GLOVE BIOGEL PI INDICATOR 8 (GLOVE) ×1
GLOVE BIOGEL PI INDICATOR 8.5 (GLOVE) ×1
GLOVE SS BIOGEL STRL SZ 8 (GLOVE) ×1 IMPLANT
GLOVE SUPERSENSE BIOGEL SZ 8 (GLOVE) ×1
GOWN PREVENTION PLUS XLARGE (GOWN DISPOSABLE) ×2 IMPLANT
GOWN PREVENTION PLUS XXLARGE (GOWN DISPOSABLE) ×2 IMPLANT
GOWN STRL NON-REIN LRG LVL3 (GOWN DISPOSABLE) ×2 IMPLANT
HANDPIECE INTERPULSE COAX TIP (DISPOSABLE) ×1
HOOD PEEL AWAY FACE SHEILD DIS (HOOD) ×2 IMPLANT
IMMOBILIZER KNEE 20 (SOFTGOODS)
IMMOBILIZER KNEE 20 THIGH 36 (SOFTGOODS) IMPLANT
IMMOBILIZER KNEE 22 UNIV (SOFTGOODS) ×2 IMPLANT
IMMOBILIZER KNEE 24 THIGH 36 (MISCELLANEOUS) IMPLANT
IMMOBILIZER KNEE 24 UNIV (MISCELLANEOUS)
KIT BASIN OR (CUSTOM PROCEDURE TRAY) ×2 IMPLANT
KIT ROOM TURNOVER OR (KITS) ×2 IMPLANT
MANIFOLD NEPTUNE II (INSTRUMENTS) ×2 IMPLANT
NEEDLE HYPO 21X1 ECLIPSE (NEEDLE) ×2 IMPLANT
NS IRRIG 1000ML POUR BTL (IV SOLUTION) ×2 IMPLANT
PACK TOTAL JOINT (CUSTOM PROCEDURE TRAY) ×2 IMPLANT
PAD ARMBOARD 7.5X6 YLW CONV (MISCELLANEOUS) ×4 IMPLANT
SET HNDPC FAN SPRY TIP SCT (DISPOSABLE) ×1 IMPLANT
SPONGE GAUZE 4X4 12PLY (GAUZE/BANDAGES/DRESSINGS) ×2 IMPLANT
STAPLER VISISTAT 35W (STAPLE) IMPLANT
SUCTION FRAZIER TIP 10 FR DISP (SUCTIONS) IMPLANT
SUT MNCRL AB 3-0 PS2 18 (SUTURE) IMPLANT
SUT VIC AB 0 CT1 27 (SUTURE) ×2
SUT VIC AB 0 CT1 27XBRD ANBCTR (SUTURE) ×2 IMPLANT
SUT VIC AB 2-0 CT1 27 (SUTURE) ×2
SUT VIC AB 2-0 CT1 TAPERPNT 27 (SUTURE) ×2 IMPLANT
SUT VLOC 180 0 24IN GS25 (SUTURE) ×2 IMPLANT
SYR 50ML LL SCALE MARK (SYRINGE) ×2 IMPLANT
TOWEL OR 17X24 6PK STRL BLUE (TOWEL DISPOSABLE) ×2 IMPLANT
TOWEL OR 17X26 10 PK STRL BLUE (TOWEL DISPOSABLE) ×2 IMPLANT
TRAY FOLEY CATH 14FR (SET/KITS/TRAYS/PACK) ×2 IMPLANT
WATER STERILE IRR 1000ML POUR (IV SOLUTION) ×4 IMPLANT

## 2012-11-21 NOTE — Plan of Care (Signed)
Problem: Consults Goal: Diagnosis- Total Joint Replacement Primary Total Knee RIght     

## 2012-11-21 NOTE — Interval H&P Note (Signed)
History and Physical Interval Note:  11/21/2012 9:57 AM  Johnathan Ayala  has presented today for surgery, with the diagnosis of RIGHT KNEE DEGENERATIVE JOINT DISEASE  The various methods of treatment have been discussed with the patient and family. After consideration of risks, benefits and other options for treatment, the patient has consented to  Procedure(s): TOTAL KNEE ARTHROPLASTY (Right) as a surgical intervention .  The patient's history has been reviewed, patient examined, no change in status, stable for surgery.  I have reviewed the patient's chart and labs.  Questions were answered to the patient's satisfaction.     Dianey Suchy G

## 2012-11-21 NOTE — Anesthesia Postprocedure Evaluation (Signed)
  Anesthesia Post-op Note  Patient: Johnathan Ayala  Procedure(s) Performed: Procedure(s): TOTAL KNEE ARTHROPLASTY (Right)  Patient Location: PACU  Anesthesia Type:General  Level of Consciousness: awake, alert  and oriented  Airway and Oxygen Therapy: Patient Spontanous Breathing  Post-op Pain: mild  Post-op Assessment: Post-op Vital signs reviewed, Patient's Cardiovascular Status Stable, Respiratory Function Stable, Patent Airway and Pain level controlled  Post-op Vital Signs: stable  Complications: No apparent anesthesia complications

## 2012-11-21 NOTE — Transfer of Care (Signed)
Immediate Anesthesia Transfer of Care Note  Patient: Johnathan Ayala  Procedure(s) Performed: Procedure(s): TOTAL KNEE ARTHROPLASTY (Right)  Patient Location: PACU  Anesthesia Type:General  Level of Consciousness: sedated  Airway & Oxygen Therapy: Patient Spontanous Breathing and Patient connected to nasal cannula oxygen  Post-op Assessment: Report given to PACU RN and Post -op Vital signs reviewed and stable  Post vital signs: Reviewed and stable  Complications: No apparent anesthesia complications

## 2012-11-21 NOTE — Progress Notes (Signed)
Orthopedic Tech Progress Note Patient Details:  Johnathan Ayala [birth year] [id number] Applied CPM to RLE.  Applied OHF with trapeze to pt.'s bed.  Left Footsie Roll with pt.'s nurse. CPM Right Knee CPM Right Knee: On Right Knee Flexion (Degrees): 60 Right Knee Extension (Degrees): 0   Lesle Chris 11/21/2012, 1:42 PM

## 2012-11-21 NOTE — Progress Notes (Signed)
Respiratory Care CPAP setup for patient. Patient is using his own mask. CPAP set at 11cm.

## 2012-11-21 NOTE — Evaluation (Signed)
Physical Therapy Evaluation Patient Details Name: Johnathan Ayala MRN: [id number] DOB: 04/22/1960 Today's Date: 11/21/2012 Time: 3664-4034 PT Time Calculation (min): 42 min  PT Assessment / Plan / Recommendation History of Present Illness  RTKA 11/21/12.  Clinical Impression  Pt tolerated ambulation in room. Pt will benefit from PT to address problems. Pt requires frequent cues for safety.    PT Assessment  Patient needs continued PT services    Follow Up Recommendations  Home health PT    Does the patient have the potential to tolerate intense rehabilitation      Barriers to Discharge        Equipment Recommendations  None recommended by PT    Recommendations for Other Services     Frequency 7X/week    Precautions / Restrictions Precautions Precautions: Fall Required Braces or Orthoses: Knee Immobilizer - Right Restrictions Weight Bearing Restrictions: Yes RLE Weight Bearing: Weight bearing as tolerated   Pertinent Vitals/Pain Afer ambulating 7 R knee, ice applied.     Mobility  Bed Mobility Bed Mobility: Supine to Sit;Sitting - Scoot to [place] of Bed;Sit to Supine Supine to Sit: 4: Min assist Sitting - Scoot to Edge of Bed: 4: Min guard Sit to Supine: 4: Min assist Details for Bed Mobility Assistance: multimodal cues for safety, use of UEs to assist  scooting to the edge. Pt uses UE to pick up RLE to self assist leg onto bed. Transfers Transfers: Sit to Stand;Stand to Sit Sit to Stand: 4: Min assist;From elevated surface;From bed;From chair/3-in-1;With upper extremity assist Stand to Sit: To bed;To chair/3-in-1;4: Min assist;With upper extremity assist Details for Transfer Assistance: cues for safety, use of UE's Ambulation/Gait Ambulation/Gait Assistance: 4: Min assist Ambulation Distance (Feet): 20 Feet Assistive device: Rolling walker Ambulation/Gait Assistance Details: cues for safe use ofwalker, sequence, cues to not step away from RW as pt neared  bed.pulsive. Gait Pattern: Step-to pattern;Step-through pattern;Antalgic General Gait Details: pulsive.    Exercises     PT Diagnosis: Difficulty walking;Acute pain  PT Problem List: Decreased strength;Decreased range of motion;Decreased activity tolerance;Decreased mobility;Decreased coordination;Decreased cognition;Decreased knowledge of use of DME;Decreased safety awareness;Decreased knowledge of precautions;Pain PT Treatment Interventions: DME instruction;Gait training;Stair training;Functional mobility training;Therapeutic activities;Therapeutic exercise;Patient/family education     PT Goals(Current goals can be found in the care plan section) Acute Rehab PT Goals Patient Stated Goal: I want to get up, I am in a bind. I want to walk normally. PT Goal Formulation: With patient Time For Goal Achievement: 11/28/12 Potential to Achieve Goals: Good  Visit Information  Last PT Received On: 11/21/12 Assistance Needed: +1 History of Present Illness: RTKA 11/21/12.       Prior Functioning  Home Living Family/patient expects to be discharged to:: Private residence Living Arrangements: Spouse/significant other Available Help at Discharge: Family Type of Home: House Home Access: Stairs to enter Secretary/administrator of Steps: 4 Entrance Stairs-Rails: None Home Layout: Two level Alternate Level Stairs-Rails: Right Prior Function Level of Independence: Independent with assistive device(s) Comments: has been using crutches prior to surgery. Communication Communication: No difficulties    Cognition  Cognition Arousal/Alertness: Awake/alert Behavior During Therapy: Restless;Impulsive Overall Cognitive Status: Impaired/Different from baseline Area of Impairment: Following commands General Comments: some delay in following multistep commeands, requires PT input in decision making re: getting back to bed.    Extremity/Trunk Assessment Upper Extremity Assessment Upper Extremity  Assessment: Defer to OT evaluation Lower Extremity Assessment Lower Extremity Assessment: RLE deficits/detail RLE Deficits / Details: lacks 20 degrees extension, unable to  perform SLR RLE Sensation: decreased light touch   Balance    End of Session PT - End of Session Equipment Utilized During Treatment: Right knee immobilizer Activity Tolerance: Patient tolerated treatment well Patient left: in bed;with call bell/phone within reach (RN to place bed alarm on.) Nurse Communication: Mobility status;Patient requests pain meds CPM Right Knee CPM Right Knee: Off Right Knee Flexion (Degrees): 60 Right Knee Extension (Degrees): 0 Additional Comments: completed 2 hours  GP     Rada Hay 11/21/2012, 6:06 PM Blanchard Kelch PT [phone]

## 2012-11-21 NOTE — Interval H&P Note (Signed)
History and Physical Interval Note:  11/21/2012 7:31 AM  Johnathan Ayala  has presented today for surgery, with the diagnosis of RIGHT KNEE DEGENERATIVE JOINT DISEASE  The various methods of treatment have been discussed with the patient and family. After consideration of risks, benefits and other options for treatment, the patient has consented to  Procedure(s): TOTAL KNEE ARTHROPLASTY (Right) as a surgical intervention .  The patient's history has been reviewed, patient examined, no change in status, stable for surgery.  I have reviewed the patient's chart and labs.  Questions were answered to the patient's satisfaction.     Corah Willeford G

## 2012-11-21 NOTE — Anesthesia Preprocedure Evaluation (Signed)
Anesthesia Evaluation  Patient identified by MRN, date of birth, ID band Patient awake    Reviewed: Allergy & Precautions, H&P , NPO status , Patient's Chart, lab work & pertinent test results, reviewed documented beta blocker date and time   Airway Mallampati: II TM Distance: >3 FB Neck ROM: Full    Dental  (+) Teeth Intact and Dental Advisory Given   Pulmonary  breath sounds clear to auscultation        Cardiovascular Rhythm:Regular Rate:Normal     Neuro/Psych    GI/Hepatic   Endo/Other    Renal/GU      Musculoskeletal   Abdominal   Peds  Hematology   Anesthesia Other Findings   Reproductive/Obstetrics                           Anesthesia Physical Anesthesia Plan  ASA: III  Anesthesia Plan: General   Post-op Pain Management:    Induction: Intravenous  Airway Management Planned: LMA  Additional Equipment:   Intra-op Plan:   Post-operative Plan:   Informed Consent: I have reviewed the patients History and Physical, chart, labs and discussed the procedure including the risks, benefits and alternatives for the proposed anesthesia with the patient or authorized representative who has indicated his/her understanding and acceptance.   Dental advisory given  Plan Discussed with: CRNA and Anesthesiologist  Anesthesia Plan Comments:         Anesthesia Quick Evaluation

## 2012-11-21 NOTE — Op Note (Signed)
PREOP DIAGNOSIS: DJD RIGHT KNEE POSTOP DIAGNOSIS: same PROCEDURE: RIGHT TKR ANESTHESIA: General and block ATTENDING SURGEON: Reeanna Acri G ASSISTANT: Lindwood Qua PA and April Green RNFA  INDICATIONS FOR PROCEDURE: Johnathan Ayala is a 52 y.o. male who has struggled for a long time with pain due to degenerative arthritis of the right knee.  The patient has failed many conservative non-operative measures and at this point has pain which limits the ability to sleep and walk.  The patient is offered total knee replacement.  Informed operative consent was obtained after discussion of possible risks of anesthesia, infection, neurovascular injury, DVT, and death.  The importance of the post-operative rehabilitation protocol to optimize result was stressed extensively with the patient.  SUMMARY OF FINDINGS AND PROCEDURE:  Johnathan Ayala was taken to the operative suite where under the above anesthesia a right knee replacement was performed.  There were advanced degenerative changes and the bone quality was excellent.  We used the DePuy system and placed size large femur, 5 MBT tibia, 41 mm all polyethylene patella, and a size 10 mm spacer.  The patient was admitted for appropriate post-op care to include perioperative antibiotics and mechanical and pharmacologic measures for DVT prophylaxis.  DESCRIPTION OF PROCEDURE:  Johnathan Ayala was taken to the operative suite where the above anesthesia was applied.  The patient was positioned supine and prepped and draped in normal sterile fashion.  An appropriate time out was performed.  After the administration of Kefzol pre-op antibiotic the leg was elevated and exsanguinated and a tourniquet inflated. A standard longitudinal incision was made on the anterior knee.  Dissection was carried down to the extensor mechanism.  All appropriate anti-infective measures were used including the pre-operative antibiotic, betadine impregnated drape, and closed hooded exhaust  systems for each member of the surgical team.  A medial parapatellar incision was made in the extensor mechanism and the knee cap flipped and the knee flexed.  Some residual meniscal tissues were removed along with any remaining ACL/PCL tissue.  A guide was placed on the tibia and a flat cut was made on it's superior surface.  An intramedullary guide was placed in the femur and was utilized to make anterior and posterior cuts creating an appropriate flexion gap.  A second intramedullary guide was placed in the femur to make a distal cut properly balancing the knee with an extension gap equal to the flexion gap.  The three bones sized to the above mentioned sizes and the appropriate guides were placed and utilized.  A trial reduction was done and the knee easily came to full extension and the patella tracked well on flexion.  The trial components were removed and all bones were cleaned with pulsatile lavage and then dried thoroughly.  Cement was mixed and was pressurized onto the bones followed by placement of the aforementioned components.  Excess cement was trimmed and pressure was held on the components until the cement had hardened.  The tourniquet was deflated and a small amount of bleeding was controlled with cautery and pressure.  The knee was irrigated thoroughly.  The extensor mechanism was re-approximated with Quill suture in running fashion.  The knee was flexed and the repair was solid.  The subcutaneous tissues were re-approximated with #0 and #2-0 vicryl and the skin closed with a subcuticular stitch and steristrips.  A sterile dressing was applied.  Intraoperative fluids, EBL, and tourniquet time can be obtained from anesthesia records.  DISPOSITION:  The patient was taken to recovery  room in stable condition and admitted for appropriate post-op care to include peri-operative antibiotic and DVT prophylaxis with mechanical and pharmacologic measures.  Johnathan Ayala G 11/21/2012, 12:13 PM

## 2012-11-21 NOTE — Anesthesia Procedure Notes (Addendum)
Procedure Name: LMA Insertion Date/Time: 11/21/2012 10:31 AM Performed by: Sharlene Dory E Pre-anesthesia Checklist: Patient identified, Emergency Drugs available, Suction available, Patient being monitored and Timeout performed Patient Re-evaluated:Patient Re-evaluated prior to inductionOxygen Delivery Method: Circle system utilized Preoxygenation: Pre-oxygenation with 100% oxygen Intubation Type: IV induction Ventilation: Mask ventilation without difficulty LMA: LMA inserted LMA Size: 4.0 Number of attempts: 1 Placement Confirmation: positive ETCO2 and breath sounds checked- equal and bilateral Tube secured with: Tape Dental Injury: Teeth and Oropharynx as per pre-operative assessment    Anesthesia Regional Block:  Adductor canal block  Pre-Anesthetic Checklist: ,, timeout performed, Correct Patient, Correct Site, Correct Laterality, Correct Procedure, Correct Position, site marked, Risks and benefits discussed,  Surgical consent,  Pre-op evaluation,  At surgeon's request and post-op pain management  Laterality: Right  Prep: chloraprep       Needles:  Injection technique: Single-shot     Needle Length:cm 9 cm Needle Gauge: 22 and 22 G    Additional Needles:  Procedures: ultrasound guided (picture in chart) Adductor canal block Narrative:  Start time: 11/21/2012 10:45 AM End time: 11/21/2012 10:50 AM Injection made incrementally with aspirations every 5 mL.  Additional Notes: 30 cc 0.5% marcaine 1:200 Epi with 4 mg decadron injected easily

## 2012-11-21 NOTE — Preoperative (Signed)
Beta Blockers   Reason not to administer Beta Blockers:Not Applicable 

## 2012-11-22 ENCOUNTER — Encounter (HOSPITAL_COMMUNITY): Payer: Self-pay | Admitting: Orthopaedic Surgery

## 2012-11-22 LAB — CBC
HCT: 37.5 % — ABNORMAL LOW (ref 39.0–52.0)
Hemoglobin: 13.2 g/dL (ref 13.0–17.0)
MCH: 29.7 pg (ref 26.0–34.0)
MCHC: 35.2 g/dL (ref 30.0–36.0)
MCV: 84.3 fL (ref 78.0–100.0)

## 2012-11-22 LAB — BASIC METABOLIC PANEL
BUN: 10 mg/dL (ref 6–23)
Calcium: 9.1 mg/dL (ref 8.4–10.5)
Creatinine, Ser: 0.77 mg/dL (ref 0.50–1.35)
GFR calc Af Amer: >90 mL/min (ref >90)
GFR calc non Af Amer: >90 mL/min (ref >90)
Glucose, Bld: 137 mg/dL — ABNORMAL HIGH (ref 70–99)

## 2012-11-22 NOTE — Progress Notes (Signed)
UR COMPLETED  

## 2012-11-22 NOTE — Evaluation (Signed)
Occupational Therapy Evaluation Patient Details Name: Johnathan Ayala MRN: [id number] DOB: Sep 18, 1960 Today's Date: 11/22/2012 Time: 4098-1191 OT Time Calculation (min): 27 min  OT Assessment / Plan / Recommendation History of present illness 52 yo male s/p Rt TKA with KI    Clinical Impression   Patient is s/p Rt TKA surgery resulting in functional limitations due to the deficits listed below (see OT problem list).  Patient will benefit from skilled OT acutely to increase independence and safety with ADLS to allow discharge HHOT.     OT Assessment  Patient needs continued OT Services    Follow Up Recommendations  Home health OT    Barriers to Discharge      Equipment Recommendations  None recommended by OT    Recommendations for Other Services    Frequency  Min 2X/week    Precautions / Restrictions Precautions Precautions: Fall Required Braces or Orthoses: Knee Immobilizer - Right Knee Immobilizer - Right: On except when in CPM Restrictions Weight Bearing Restrictions: Yes RLE Weight Bearing: Weight bearing as tolerated   Pertinent Vitals/Pain None reported    ADL  Lower Body Dressing: Minimal assistance Where Assessed - Lower Body Dressing: Unsupported sit to stand Toilet Transfer: Min guard Toilet Transfer Method: Sit to Barista: Raised toilet seat with arms (or 3-in-1 over toilet) Equipment Used: Gait belt;Rolling walker;Knee Immobilizer Transfers/Ambulation Related to ADLs: Pt completed sit<>Stand Min guard (A) with v/c for safety. Pt educated on use of sheet for leg lift ADL Comments: Pt and wife educated on don / doff ki. Pts wife don brace with pt MOD I. Pt completed bed mobility MOD I with sheet. Pt educated on Rt LE positioning for sit<>Stand. Pt educated on dressing and icing. Pt provided ice supine .     OT Diagnosis: Generalized weakness;Acute pain  OT Problem List: Decreased strength;Decreased activity tolerance;Impaired balance  (sitting and/or standing);Decreased safety awareness;Decreased knowledge of use of DME or AE;Decreased knowledge of precautions;Pain OT Treatment Interventions: Self-care/ADL training;Therapeutic exercise;DME and/or AE instruction;Therapeutic activities;Patient/family education;Balance training   OT Goals(Current goals can be found in the care plan section) Acute Rehab OT Goals Patient Stated Goal: to go home tomorrow OT Goal Formulation: With patient/family Time For Goal Achievement: 12/06/12 Potential to Achieve Goals: Good ADL Goals Pt Will Perform Tub/Shower Transfer: Shower transfer;with supervision;rolling walker  Visit Information  Last OT Received On: 11/22/12 Assistance Needed: +1 History of Present Illness: 52 yo male s/p Rt TKA with KI        Prior Functioning     Home Living Family/patient expects to be discharged to:: Private residence Living Arrangements: Spouse/significant other Available Help at Discharge: Family Type of Home: House Home Access: Stairs to enter Secretary/administrator of Steps: 4 Entrance Stairs-Rails: None Home Layout: Two level;Bed/bath upstairs Alternate Level Stairs-Rails: Right Home Equipment: Crutches Prior Function Level of Independence: Independent with assistive device(s) Comments: has been using crutches prior to surgery. Communication Communication: No difficulties Dominant Hand: Right         Vision/Perception Vision - Assessment Eye Alignment: Within Functional Limits Vision Assessment: Vision not tested   Cognition  Cognition Arousal/Alertness: Awake/alert Behavior During Therapy: WFL for tasks assessed/performed Overall Cognitive Status: Impaired/Different from baseline Area of Impairment: Safety/judgement Safety/Judgement: Decreased awareness of safety (slightly implusive) General Comments: Pt cont's to be drowsy.  Pt requires frequent cues for increased safety awareness throughout session.      Extremity/Trunk  Assessment Upper Extremity Assessment Upper Extremity Assessment: Overall WFL for tasks assessed Lower  Extremity Assessment Lower Extremity Assessment: Defer to PT evaluation Cervical / Trunk Assessment Cervical / Trunk Assessment: Normal     Mobility Bed Mobility Bed Mobility: Supine to Sit;Sitting - Scoot to Edge of Bed;Sit to Supine Supine to Sit: 6: Modified independent (Device/Increase time);HOB elevated Sitting - Scoot to Edge of Bed: 6: Modified independent (Device/Increase time) Sit to Supine: 6: Modified independent (Device/Increase time);HOB elevated Details for Bed Mobility Assistance: using sheet as lift lifter mod I Transfers Transfers: Sit to Stand;Stand to Sit Sit to Stand: 4: Min guard;With upper extremity assist;From bed Stand to Sit: 4: Min guard;With upper extremity assist;To bed Details for Transfer Assistance: cues to extend Rt LE out prior to sitting and reaching back with hands     Exercise Total Joint Exercises Heel Slides: AAROM;Strengthening;Right;10 reps;Supine Straight Leg Raises: AAROM;Strengthening;Right;10 reps;Supine Goniometric ROM: PROM R knee flexion ~65 degrees   Balance     End of Session OT - End of Session Activity Tolerance: Patient tolerated treatment well Patient left: in bed;with call bell/phone within reach;with family/visitor present Nurse Communication: Mobility status;Precautions CPM Right Knee CPM Right Knee: Off  GO     Harolyn Rutherford 11/22/2012, 5:40 PM Pager: [phone]

## 2012-11-22 NOTE — Progress Notes (Signed)
Subjective: 1 Day Post-Op Procedure(s) (LRB): TOTAL KNEE ARTHROPLASTY (Right)  Activity level:  Weightbearing as tolerated patient currently in CPM Diet tolerance:  ok Voiding:  ok Patient reports pain as 8 on 0-10 scale.    Objective: Vital signs in last 24 hours: Temp:  [97.2 F (36.2 C)-98.5 F (36.9 C)] 98.4 F (36.9 C) (10/14 2133) Pulse Rate:  [62-76] 76 (10/14 2133) Resp:  [0-28] 18 (10/15 0347) BP: (119-151)/(63-90) 135/72 mmHg (10/14 2133) SpO2:  [91 %-100 %] 98 % (10/15 0347)  Labs:  Recent Labs  11/22/12 0420  HGB 13.2    Recent Labs  11/22/12 0420  WBC 15.6*  RBC 4.45  HCT 37.5*  PLT 226    Recent Labs  11/22/12 0420  NA 135  K 4.2  CL 98  CO2 24  BUN 10  CREATININE 0.77  GLUCOSE 137*  CALCIUM 9.1   No results found for this basename: LABPT, INR,  in the last 72 hours  Physical Exam:  Neurologically intact ABD soft Neurovascular intact Sensation intact distally Intact pulses distally Dorsiflexion/Plantar flexion intact No cellulitis present Compartment soft  Assessment/Plan:  1 Day Post-Op Procedure(s) (LRB): TOTAL KNEE ARTHROPLASTY (Right) Advance diet Up with therapy D/C IV fluids Plan for discharge tomorrow if okay with therapy. Continue ASA 325 1 pill twice a day x2 weeks for DVT prophylaxis. Continue SCDs. Pain currently is under relatively good control on current regimen. Patient will need home health for therapy discharge.    Sanvika Cuttino R 11/22/2012, 8:30 AM

## 2012-11-22 NOTE — Progress Notes (Signed)
Physical Therapy Treatment Patient Details Name: Johnathan Ayala MRN: [id number] DOB: 06/17/1960 Today's Date: 11/22/2012 Time: 5621-3086 PT Time Calculation (min): 26 min  PT Assessment / Plan / Recommendation  History of Present Illness RTKA 11/21/12.   PT Comments   Pt cont's to be drowsy but improved from AM session.  He does require frequent cueing with OOB activity for increased safety awareness as he easily becomes distracted, tends to take hands of walker & take steps, & has trouble keeping eyes open.     Follow Up Recommendations  Home health PT     Does the patient have the potential to tolerate intense rehabilitation     Barriers to Discharge        Equipment Recommendations  None recommended by PT    Recommendations for Other Services    Frequency 7X/week   Progress towards PT Goals Progress towards PT goals: Progressing toward goals  Plan Current plan remains appropriate    Precautions / Restrictions Restrictions RLE Weight Bearing: Weight bearing as tolerated   Pertinent Vitals/Pain 7/10 R knee.  Repositioned for comfort.     Mobility  Bed Mobility Bed Mobility: Sit to Supine Sit to Supine: 4: Min assist;HOB flat Details for Bed Mobility Assistance: (A) to lift RLE back into bed Transfers Transfers: Sit to Stand;Stand to Sit Sit to Stand: 4: Min assist Stand to Sit: 4: Min guard Details for Transfer Assistance: cues for hand placement Ambulation/Gait Ambulation/Gait Assistance: 4: Min guard;4: Min Environmental consultant (Feet): 75 Feet Assistive device: Rolling walker Ambulation/Gait Assistance Details: cues for sequencing, safety,  keep eyes open & look ahead, & body positioning inside RW.   Gait Pattern: Step-to pattern;Decreased step length - right;Decreased step length - left;Decreased weight shift to right Gait velocity: Min (A) at times for balance Stairs: No Wheelchair Mobility Wheelchair Mobility: No    Exercises Total Joint  Exercises Heel Slides: AAROM;Strengthening;Right;10 reps;Supine Straight Leg Raises: AAROM;Strengthening;Right;10 reps;Supine Goniometric ROM: PROM R knee flexion ~65 degrees     PT Goals (current goals can now be found in the care plan section) Acute Rehab PT Goals PT Goal Formulation: With patient Time For Goal Achievement: 11/28/12 Potential to Achieve Goals: Good  Visit Information  Last PT Received On: 11/22/12 Assistance Needed: +1 History of Present Illness: RTKA 11/21/12.    Subjective Data      Cognition  Cognition Arousal/Alertness: Lethargic;Suspect due to medications Overall Cognitive Status: Impaired/Different from baseline Area of Impairment: Safety/judgement;Attention General Comments: Pt cont's to be drowsy.  Pt requires frequent cues for increased safety awareness throughout session.      Balance     End of Session PT - End of Session Equipment Utilized During Treatment: Gait belt;Right knee immobilizer Activity Tolerance: Patient tolerated treatment well Patient left: in bed;in CPM (0-65);with call bell/phone within reach Nurse Communication: Mobility status   GP     Lara Mulch 11/22/2012, 2:31 PM  Verdell Face, PTA [phone] 11/22/2012

## 2012-11-22 NOTE — Progress Notes (Signed)
Prior to admission MD office arranged home health PT with [place].  [employer] provided CPM, 3:1, RW

## 2012-11-22 NOTE — Progress Notes (Signed)
Physical Therapy Treatment Patient Details Name: Johnathan Ayala MRN: [id number] DOB: 09/29/1960 Today's Date: 11/22/2012 Time: 4098-1191 PT Time Calculation (min): 24 min  PT Assessment / Plan / Recommendation  History of Present Illness RTKA 11/21/12.   PT Comments   Pt very drowsy today.  Had difficulty keeping eyes open but improved with activity.  Pt able to increase ambulation distance.    Follow Up Recommendations  Home health PT     Does the patient have the potential to tolerate intense rehabilitation     Barriers to Discharge        Equipment Recommendations  None recommended by PT    Recommendations for Other Services    Frequency 7X/week   Progress towards PT Goals Progress towards PT goals: Progressing toward goals  Plan Current plan remains appropriate    Precautions / Restrictions Restrictions Weight Bearing Restrictions: Yes RLE Weight Bearing: Weight bearing as tolerated   Pertinent Vitals/Pain C/o Rt knee pain.  Did not rate when asked.  States "it just hurts".  Repositioned for comfort.      Mobility  Bed Mobility Bed Mobility: Supine to Sit;Sitting - Scoot to Edge of Bed Supine to Sit: 4: Min guard;With rails Sitting - Scoot to Edge of Bed: 4: Min guard Details for Bed Mobility Assistance: Repeated cues to come to EOB & then moved impulsively once he understood.   Transfers Transfers: Sit to Stand;Stand to Sit Sit to Stand: 4: Min assist;With upper extremity assist;From bed Stand to Sit: 4: Min assist;With upper extremity assist;With armrests;To chair/3-in-1 Details for Transfer Assistance: cues for hand placement & safety.  (A) to achieve standing, balance, & controlled descent.   Ambulation/Gait Ambulation/Gait Assistance: 4: Min guard Ambulation Distance (Feet): 50 Feet Assistive device: Rolling walker Ambulation/Gait Assistance Details: cues for sequencing, keep eyes open, increased floor clearance with LLE,  & safety Gait Pattern: Step-to  pattern;Decreased weight shift to right;Antalgic;Decreased step length - left (decreased floor clearance) Stairs: No Wheelchair Mobility Wheelchair Mobility: No    Exercises Total Joint Exercises Ankle Circles/Pumps: AAROM;Both;10 reps Quad Sets: AROM;Right;5 reps Straight Leg Raises: AAROM;Strengthening;Right;10 reps;Supine     PT Goals (current goals can now be found in the care plan section) Acute Rehab PT Goals Patient Stated Goal: I want to get up, I am in a bind. I want to walk normally. PT Goal Formulation: With patient Time For Goal Achievement: 11/28/12 Potential to Achieve Goals: Good  Visit Information  Last PT Received On: 11/22/12 Assistance Needed: +1 History of Present Illness: RTKA 11/21/12.    Subjective Data  Patient Stated Goal: I want to get up, I am in a bind. I want to walk normally.   Cognition  Cognition Arousal/Alertness: Lethargic;Suspect due to medications Overall Cognitive Status: Impaired/Different from baseline Area of Impairment: Awareness;Attention;Safety/judgement Current Attention Level: Sustained Safety/Judgement: Decreased awareness of safety General Comments: Pt very drowsy today.  Difficult to keep eyes open.  More alert with ambulation but requires max cues sequencing & safety due to pt taking 1 hand of RW & placing around shoulder of therapist.      Balance     End of Session PT - End of Session Equipment Utilized During Treatment: Gait belt;Right knee immobilizer Patient left: in chair;with call bell/phone within reach Nurse Communication: Mobility status CPM Right Knee CPM Right Knee: Off   GP     Lara Mulch 11/22/2012, 10:47 AM   Verdell Face, PTA [phone] 11/22/2012

## 2012-11-23 ENCOUNTER — Encounter (HOSPITAL_COMMUNITY): Payer: Self-pay | Admitting: General Practice

## 2012-11-23 LAB — CBC
Hemoglobin: 12.2 g/dL — ABNORMAL LOW (ref 13.0–17.0)
MCH: 29.5 pg (ref 26.0–34.0)
MCHC: 35 g/dL (ref 30.0–36.0)
Platelets: 188 10*3/uL (ref 150–400)
RDW: 13.9 % (ref 11.5–15.5)

## 2012-11-23 MED ORDER — ASPIRIN 325 MG PO TBEC: 325.0000 mg | DELAYED_RELEASE_TABLET | Freq: Two times a day (BID) | ORAL | Status: AC

## 2012-11-23 MED ORDER — HYDROCODONE-ACETAMINOPHEN 7.5-325 MG PO TABS: ORAL_TABLET | ORAL | Status: DC

## 2012-11-23 MED ORDER — METHOCARBAMOL 500 MG PO TABS: 500.0000 mg | ORAL_TABLET | Freq: Four times a day (QID) | ORAL | Status: AC | PRN

## 2012-11-23 NOTE — Progress Notes (Signed)
   CARE MANAGEMENT NOTE 11/23/2012  Patient:  KELL, FERRIS   Account Number:  [id number]  Date Initiated:  11/21/2012  Documentation initiated by:  [facility]  Subjective/Objective Assessment:   TOTAL KNEE ARTHROPLASTY (Right)     Action/Plan:   HH, waiting PT/OT evaluation   Anticipated DC Date:  11/23/2012   Anticipated DC Plan:  HOME W HOME HEALTH SERVICES      DC Planning Services  CM consult      [facility] Choice  HOME HEALTH   Choice offered to / List presented to:  C-1 Patient   DME arranged  3-N-1  CPM  WALKER - ROLLING      DME agency  [employer]     HH arranged  HH-2 PT      HH agency  [facility]   Status of service:  Completed, signed off Medicare Important Message given?   (If response is "NO", the following Medicare IM given date fields will be blank) Date Medicare IM given:   Date Additional Medicare IM given:    Discharge Disposition:  HOME W HOME HEALTH SERVICES  Per UR Regulation:    If discussed at Long Length of Stay Meetings, dates discussed:    Comments:

## 2012-11-23 NOTE — Progress Notes (Signed)
Occupational Therapy Treatment Patient Details Name: Johnathan Ayala MRN: [id number] DOB: Jan 10, 1961 Today's Date: 11/23/2012 Time: 4098-1191 OT Time Calculation (min): 31 min  OT Assessment / Plan / Recommendation  History of present illness 52 yo male s/p Rt TKA with KI    OT comments  This 52 yo s/p above presents to acute OT making progress but a bit impulsive and intermittent difficulty with following commands and problem solving. Will benefit from [facility] as recommended yesterday.  Follow Up Recommendations  Home health OT       Equipment Recommendations  None recommended by OT       Frequency Min 2X/week   Progress towards OT Goals Progress towards OT goals: Progressing toward goals  Plan Discharge plan remains appropriate    Precautions / Restrictions Precautions Precautions: Fall Required Braces or Orthoses:  (KI dc'd per MD order) Restrictions RLE Weight Bearing: Weight bearing as tolerated   Pertinent Vitals/Pain Pt c/o knee pain, but I did not ask him to rate--offered to get him an ice pack and he said that would be nice. Applied 2 icepacks under his KI.    ADL  Tub/Shower Transfer: Insurance risk surveyor Method: Science writer: Walk in shower (stepping over threshold backwards to sit on 3n1) Equipment Used: Gait belt;Rolling walker Transfers/Ambulation Related to ADLs: min guard A ADL Comments: Pt having difficulty with the sequence of getting into and out of shower stall even with practice--so typed up the sequence of this for him of getting in and out of shower stall to 3n1 as well as the sequence of getting his LB dressed and undressed.     OT Goals(current goals can now be found in the care plan section)    Visit Information  Last OT Received On: 11/23/12 Assistance Needed: +1 History of Present Illness: 52 yo male s/p Rt TKA with KI        Prior Functioning  Home Living Family/patient expects to be discharged to::  Private residence Living Arrangements: Spouse/significant other    Cognition  Cognition Arousal/Alertness: Awake/alert Behavior During Therapy: Impulsive Overall Cognitive Status: No family/caregiver present to determine baseline cognitive functioning Area of Impairment: Following commands;Problem solving Current Attention Level: Sustained Memory: Decreased short-term memory Following Commands: Follows one step commands inconsistently;Follows one step commands with increased time Safety/Judgement: Decreased awareness of safety Problem Solving: Slow processing;Difficulty sequencing;Requires verbal cues;Requires tactile cues     Mobility  Transfers Transfers: Sit to Stand;Stand to Sit Sit to Stand: 4: Min guard;With upper extremity assist;With armrests;From chair/3-in-1 Stand to Sit: 4: Min guard;With upper extremity assist;With armrests;To chair/3-in-1 Details for Transfer Assistance: VCs for safe hand placement          End of Session OT - End of Session Equipment Utilized During Treatment: Gait belt;Rolling walker Activity Tolerance: Patient tolerated treatment well Patient left: in chair;with call bell/phone within reach       Evette Georges [phone] 11/23/2012, 2:14 PM

## 2012-11-23 NOTE — Progress Notes (Signed)
Clinical Child psychotherapist (CSW) received referral for SNF. Per chart review and case management's note patient is going home with home health. Please reconsult if further social work needs arise. CSW signing off.   Jetta Lout, LCSWA Weekend CSW [phone]

## 2012-11-23 NOTE — Progress Notes (Signed)
Subjective: 2 Days Post-Op Procedure(s) (LRB): TOTAL KNEE ARTHROPLASTY (Right)  Activity level:  wbat Diet tolerance:  ok Voiding:  ok Patient reports pain as 3 on 0-10 scale.    Objective: Vital signs in last 24 hours: Temp:  [98.7 F (37.1 C)-98.8 F (37.1 C)] 98.8 F (37.1 C) (10/16 0616) Pulse Rate:  [81-86] 86 (10/16 0616) Resp:  [16-18] 16 (10/16 0616) BP: (112-122)/(67-73) 122/73 mmHg (10/16 0616) SpO2:  [34 %-98 %] 95 % (10/16 0616)  Labs:  Recent Labs  11/22/12 0420 11/23/12 0512  HGB 13.2 12.2*    Recent Labs  11/22/12 0420 11/23/12 0512  WBC 15.6* 11.0*  RBC 4.45 4.13*  HCT 37.5* 34.9*  PLT 226 188    Recent Labs  11/22/12 0420  NA 135  K 4.2  CL 98  CO2 24  BUN 10  CREATININE 0.77  GLUCOSE 137*  CALCIUM 9.1   No results found for this basename: LABPT, INR,  in the last 72 hours  Physical Exam:  Neurologically intact ABD soft Neurovascular intact Sensation intact distally Intact pulses distally Dorsiflexion/Plantar flexion intact No cellulitis present Compartment soft  Assessment/Plan:  2 Days Post-Op Procedure(s) (LRB): TOTAL KNEE ARTHROPLASTY (Right) Advance diet Up with therapy D/C IV fluids Discharge home with home health ASA 325 bid    Cache Decoursey R 11/23/2012, 2:16 PM

## 2012-11-23 NOTE — Progress Notes (Signed)
Physical Therapy Treatment Patient Details Name: Johnathan Ayala MRN: [id number] DOB: 1960-11-28 Today's Date: 11/23/2012 Time: 1212-1308 PT Time Calculation (min): 56 min  PT Assessment / Plan / Recommendation  History of Present Illness 52 yo male s/p Rt TKA with KI    PT Comments   Pt with difficulty attending to tasks during session--easily drifting off to sleep, conversation wandering off topic. Max cues to elicit muscle contraction with exercises. He mobilized well with max cues for sequencing. Repeated stairs x 4 for repetition and provided printed hand-out with pictures. He did not lose his balance on stairs, however did not always remember the proper sequence. Notified RN pt is OK to d/c home from PT standpoint. (Appears memory issues may not be new. HHPT will continue with education)   Follow Up Recommendations  Home health PT;Supervision/Assistance - 24 hour     Does the patient have the potential to tolerate intense rehabilitation     Barriers to Discharge        Equipment Recommendations  None recommended by PT    Recommendations for Other Services    Frequency 7X/week   Progress towards PT Goals Progress towards PT goals: Progressing toward goals  Plan Current plan remains appropriate    Precautions / Restrictions Precautions Precautions: Fall Required Braces or Orthoses:  (KI dc'd per MD order) Restrictions RLE Weight Bearing: Weight bearing as tolerated   Pertinent Vitals/Pain Pt unable to rate; no reports of pain with ambulation, only with ROM exercises    Mobility  Bed Mobility Bed Mobility: Sit to Supine Sit to Supine: 4: Min assist Details for Bed Mobility Assistance: pt unable to lift his leg Transfers Transfers: Sit to Stand;Stand to Sit Sit to Stand: 4: Min assist;4: Min guard Stand to Sit: 4: Min assist;4: Min guard Details for Transfer Assistance: cues for sequencing with crutches; repeated x5 with poor carryover;  impulsive Ambulation/Gait Ambulation/Gait Assistance: 4: Min assist Ambulation Distance (Feet): 50 Feet Assistive device: Crutches Ambulation/Gait Assistance Details: utilized crutches to assess safety for use of crutches on steps (4 to enter without rails) Gait Pattern: Step-to pattern;Wide base of support General Gait Details: max cues initially for sequencing; by end of session, ambulating with correct sequence >90% of time Stairs: Yes Stairs Assistance: 4: Min assist Stairs Assistance Details (indicate cue type and reason): max verbal and tactile cues for sequencing and assuring he had crutch tips fully on the step before advancing (after visual demonstration by PT with crutches) Stair Management Technique: No rails;One rail Right;Forwards;With crutches;Step to pattern;Other (comment) (Rt KI used for stairs) Number of Stairs: 5 (x4 repetitions)    Exercises Total Joint Exercises Ankle Circles/Pumps: AROM;Both;10 reps Quad Sets: AROM;Right;10 reps Short Arc QuadBarbaraann Boys;Right;10 reps Straight Leg Raises: AAROM;Right;5 reps Knee Flexion: AAROM;Right;5 reps;Seated (with prolonged stretch x 30 seconds) Goniometric ROM: 10 to 80 degrees flexion   PT Diagnosis:    PT Problem List:   PT Treatment Interventions:     PT Goals (current goals can now be found in the care plan section)    Visit Information  Last PT Received On: 11/23/12 Assistance Needed: +1 History of Present Illness: 52 yo male s/p Rt TKA with KI     Subjective Data  Subjective: Reports he wants to go home today   Cognition  Cognition Arousal/Alertness: Lethargic (falling asleep multiple times during session) Behavior During Therapy: Impulsive Overall Cognitive Status: No family/caregiver present to determine baseline cognitive functioning Area of Impairment: Attention;Following commands;Safety/judgement;Problem solving Current Attention Level: Sustained  Memory: Decreased short-term memory Following Commands:  Follows one step commands inconsistently Safety/Judgement: Decreased awareness of safety Problem Solving: Slow processing;Difficulty sequencing;Requires verbal cues;Requires tactile cues General Comments: easily drifting off to sleep each time he sits to rest; when awake, very impulsive    Balance     End of Session PT - End of Session Equipment Utilized During Treatment: Gait belt;Right knee immobilizer Activity Tolerance: Patient tolerated treatment well Patient left: in bed;with call bell/phone within reach;with bed alarm set Nurse Communication: Mobility status;Other (comment) (pt wants to go home today)   GP     Johnathan Ayala 11/23/2012, 1:41 PM Pager [phone]

## 2012-11-23 NOTE — Discharge Summary (Signed)
Patient ID: JAHMAD PETRICH MRN: [id number] DOB/AGE: January 20, 1961 52 y.o.  Admit date: 11/21/2012 Discharge date: 11/23/2012  Admission Diagnoses:  Principal Problem:   Right knee DJD   Discharge Diagnoses:  Same  Past Medical History  Diagnosis Date  . Hypertension   . Mental disorder   . Coronary artery disease     [facility] (Dr. Christella Noa)  . Pneumonia 2012  . Depression   . Anxiety   . Sleep apnea     study done 7-9 years ago . uses  c-pap  . GERD (gastroesophageal reflux disease)     heartburn   . Neuromuscular disorder     lt arm     2014      Surgeries: Procedure(s): TOTAL KNEE ARTHROPLASTY on 11/21/2012   Consultants:    Discharged Condition: Improved  Hospital Course: JURON VORHEES is an 52 y.o. male who was admitted 11/21/2012 for operative treatment ofRight knee DJD. Patient has severe unremitting pain that affects sleep, daily activities, and work/hobbies. After pre-op clearance the patient was taken to the operating room on 11/21/2012 and underwent  Procedure(s): TOTAL KNEE ARTHROPLASTY.    Patient was given perioperative antibiotics: Anti-infectives   Start     Dose/Rate Route Frequency Ordered Stop   11/21/12 1630  ceFAZolin (ANCEF) IVPB 2 g/50 mL premix     2 g 100 mL/hr over 30 Minutes Intravenous Every 6 hours 11/21/12 1449 11/21/12 2229   11/21/12 0600  ceFAZolin (ANCEF) IVPB 2 g/50 mL premix     2 g 100 mL/hr over 30 Minutes Intravenous On call to O.R. 11/20/12 1411 11/21/12 1035       Patient was given sequential compression devices, early ambulation, and chemoprophylaxis to prevent DVT.  Patient benefited maximally from hospital stay and there were no complications.    Recent vital signs: Patient Vitals for the past 24 hrs:  BP Temp Temp src Pulse Resp SpO2  11/23/12 0616 122/73 mmHg 98.8 F (37.1 C) Oral 86 16 95 %  11/23/12 0021 - - - 81 16 34 %  11/22/12 2015 112/67 mmHg 98.7 F (37.1 C) Oral 84 18 98 %     Recent laboratory  studies:  Recent Labs  11/22/12 0420 11/23/12 0512  WBC 15.6* 11.0*  HGB 13.2 12.2*  HCT 37.5* 34.9*  PLT 226 188  NA 135  --   K 4.2  --   CL 98  --   CO2 24  --   BUN 10  --   CREATININE 0.77  --   GLUCOSE 137*  --   CALCIUM 9.1  --      Discharge Medications:     Medication List    STOP taking these medications       aspirin 81 MG tablet  Replaced by:  aspirin 325 MG EC tablet     clindamycin 300 MG capsule  Commonly known as:  CLEOCIN      TAKE these medications       aspirin 325 MG EC tablet  Take 1 tablet (325 mg total) by mouth 2 (two) times daily.     atenolol 50 MG tablet  Commonly known as:  TENORMIN  Take 50 mg by mouth daily.     cloZAPine 100 MG tablet  Commonly known as:  CLOZARIL  Take 400 mg by mouth at bedtime.     Fish Oil 1000 MG Caps  Take 1 capsule by mouth 2 (two) times daily at 10 AM and 5 PM.  FLUoxetine 20 MG capsule  Commonly known as:  PROZAC  Take 60 mg by mouth daily.     gabapentin 300 MG capsule  Commonly known as:  NEURONTIN  Take 300-600 mg by mouth 2 (two) times daily. Take 1 capsule in the morning and 2 capsules in the evening     HYDROcodone-acetaminophen 7.5-325 MG per tablet  Commonly known as:  NORCO  1 or 2 Q 4-6 prn pain     hydrOXYzine 50 MG tablet  Commonly known as:  ATARAX/VISTARIL  Take 50 mg by mouth 2 (two) times daily.     lamoTRIgine 200 MG tablet  Commonly known as:  LAMICTAL  Take 200 mg by mouth 2 (two) times daily.     methocarbamol 500 MG tablet  Commonly known as:  ROBAXIN  Take 1 tablet (500 mg total) by mouth every 6 (six) hours as needed.     multivitamin with minerals Tabs tablet  Take 1 tablet by mouth daily.     ranitidine 150 MG tablet  Commonly known as:  ZANTAC  Take 150 mg by mouth 2 (two) times daily.        Diagnostic Studies: Dg Chest 2 View  11/08/2012   CLINICAL DATA:  Preop knee arthroplasty  EXAM: CHEST  2 VIEW  COMPARISON:  05/13/2011  FINDINGS: Heart size  and vascularity are normal. Lungs are clear without infiltrate effusion or mass.  IMPRESSION: No active cardiopulmonary disease.   Electronically Signed   By: Marlan Palau M.D.   On: 11/08/2012 10:23    Disposition: 06-Home-Health Care Svc      Discharge Orders   Future Orders Complete By Expires   Call MD / Call 911  As directed    Comments:     If you experience chest pain or shortness of breath, CALL 911 and be transported to the hospital emergency room.  If you develope a fever above 101 F, pus (white drainage) or increased drainage or redness at the wound, or calf pain, call your surgeon's office.   Constipation Prevention  As directed    Comments:     Drink plenty of fluids.  Prune juice may be helpful.  You may use a stool softener, such as Colace (over the counter) 100 mg twice a day.  Use MiraLax (over the counter) for constipation as needed.   Diet - low sodium heart healthy  As directed    Increase activity slowly as tolerated  As directed       Follow-up Information   Follow up with DALLDORF,PETER G, MD. Call in 2 weeks.   Specialty:  Orthopedic Surgery   Contact information:   [address] [place] [address] [phone]       Follow up with [facility] (Home Health Physical Therapy )    Contact information:   [phone]       Signed: Meleny Tregoning R 11/23/2012, 2:21 PM

## 2013-03-20 ENCOUNTER — Other Ambulatory Visit: Payer: Self-pay | Admitting: Orthopedic Surgery

## 2013-03-20 DIAGNOSIS — M545 Low back pain, unspecified: Secondary | ICD-10-CM

## 2013-03-28 ENCOUNTER — Ambulatory Visit
Admission: RE | Admit: 2013-03-28 | Discharge: 2013-03-28 | Disposition: A | Discharge status: Home / Self Care | Payer: Medicare Other | Source: Ambulatory Visit | Attending: Orthopedic Surgery | Admitting: Orthopedic Surgery

## 2013-03-28 DIAGNOSIS — M545 Low back pain, unspecified: Secondary | ICD-10-CM

## 2013-05-01 ENCOUNTER — Other Ambulatory Visit: Payer: Self-pay | Admitting: Physical Medicine and Rehabilitation

## 2013-05-01 DIAGNOSIS — M545 Low back pain, unspecified: Secondary | ICD-10-CM

## 2013-05-01 DIAGNOSIS — M79605 Pain in left leg: Secondary | ICD-10-CM

## 2013-05-02 ENCOUNTER — Ambulatory Visit
Admission: RE | Admit: 2013-05-02 | Discharge: 2013-05-02 | Disposition: A | Discharge status: Home / Self Care | Payer: Medicare Other | Source: Ambulatory Visit | Attending: Physical Medicine and Rehabilitation | Admitting: Physical Medicine and Rehabilitation

## 2013-05-02 DIAGNOSIS — M545 Low back pain, unspecified: Secondary | ICD-10-CM

## 2013-05-02 DIAGNOSIS — M79605 Pain in left leg: Secondary | ICD-10-CM

## 2013-05-02 MED ORDER — METHYLPREDNISOLONE ACETATE 40 MG/ML INJ SUSP (RADIOLOG
120.0000 mg | Freq: Once | INTRAMUSCULAR | Status: AC
  Administered 2013-05-02: 120 mg via EPIDURAL

## 2013-05-02 MED ORDER — IOHEXOL 180 MG/ML  SOLN
1.0000 mL | Freq: Once | INTRAMUSCULAR | Status: AC | PRN
  Administered 2013-05-02: 1 mL via EPIDURAL

## 2013-05-02 NOTE — Discharge Instructions (Signed)

## 2013-05-22 ENCOUNTER — Other Ambulatory Visit: Payer: Self-pay | Admitting: Orthopedic Surgery

## 2013-05-22 DIAGNOSIS — M5410 Radiculopathy, site unspecified: Secondary | ICD-10-CM

## 2013-11-02 ENCOUNTER — Other Ambulatory Visit: Payer: Self-pay | Admitting: Physical Medicine and Rehabilitation

## 2013-11-02 DIAGNOSIS — M545 Low back pain, unspecified: Secondary | ICD-10-CM

## 2013-11-02 DIAGNOSIS — M5416 Radiculopathy, lumbar region: Secondary | ICD-10-CM

## 2014-04-22 ENCOUNTER — Encounter: Payer: Self-pay | Admitting: Podiatry

## 2014-04-22 ENCOUNTER — Ambulatory Visit (INDEPENDENT_AMBULATORY_CARE_PROVIDER_SITE_OTHER): Payer: Medicare Other | Admitting: Podiatry

## 2014-04-22 VITALS — BP 133/76 | HR 67 | Resp 16 | Ht 72.0 in | Wt 205.0 lb

## 2014-04-22 DIAGNOSIS — S90121A Contusion of right lesser toe(s) without damage to nail, initial encounter: Secondary | ICD-10-CM

## 2014-04-22 NOTE — Progress Notes (Signed)
   Subjective:    Patient ID: Johnathan Ayala, male    DOB: 1960-05-26, 54 y.o.   MRN: [id number]  HPI Comments: The right great toenail fell off last week. No trauma to the toe. It turned purple and fell off. Just left the toe alone      Review of Systems  All other systems reviewed and are negative.      Objective:   Physical Exam: I have reviewed his past medical history medications allergies surgeries and social history. Pulses are strongly palpable bilateral. Neurologic sensorium is decreased persons Weinstein monofilament. Deep tendon reflexes aren't elicitable. Muscle strength +5 over 5 dorsiflexion plantar flexors and inverters everters all intrinsic musculature is intact. Orthopedic evaluation demonstrates all joints distal to the angle full range of motion without crepitation. Mild hallux varus deformity right foot mild hammertoe deformities 2 through 5 right foot. Cutaneous evaluation demonstrates complete loss of hallux nail right. Distal clavus is noted to the dorsal distal medial aspect of the hallux possibly associated with irritation or rubbing in his shoe. The hallux left also demonstrates blood beneath the nail plate left.        Assessment & Plan:  Assessment: Subungual hematoma resulting in nail loss hallux right subungual hematoma hallux left..  Plan: He will follow-up with me on an as-needed basis.

## 2014-04-23 ENCOUNTER — Ambulatory Visit: Payer: Self-pay | Admitting: Podiatry
